# Patient Record
Sex: Female | Born: 1940 | Race: White | Hispanic: No | Marital: Married | Smoking: Never smoker
Health system: Southern US, Community
[De-identification: ages and names within clinical notes are randomized; demographics above are authoritative.]

## PROBLEM LIST (undated history)

## (undated) DIAGNOSIS — E785 Hyperlipidemia, unspecified: Secondary | ICD-10-CM

## (undated) DIAGNOSIS — I491 Atrial premature depolarization: Secondary | ICD-10-CM

## (undated) DIAGNOSIS — M1990 Unspecified osteoarthritis, unspecified site: Secondary | ICD-10-CM

## (undated) DIAGNOSIS — I480 Paroxysmal atrial fibrillation: Secondary | ICD-10-CM

## (undated) DIAGNOSIS — J309 Allergic rhinitis, unspecified: Secondary | ICD-10-CM

## (undated) DIAGNOSIS — N3090 Cystitis, unspecified without hematuria: Secondary | ICD-10-CM

## (undated) DIAGNOSIS — K219 Gastro-esophageal reflux disease without esophagitis: Secondary | ICD-10-CM

## (undated) DIAGNOSIS — F419 Anxiety disorder, unspecified: Secondary | ICD-10-CM

## (undated) DIAGNOSIS — L309 Dermatitis, unspecified: Secondary | ICD-10-CM

## (undated) HISTORY — DX: Hyperlipidemia, unspecified: E78.5

## (undated) HISTORY — DX: Gastro-esophageal reflux disease without esophagitis: K21.9

## (undated) HISTORY — DX: Unspecified osteoarthritis, unspecified site: M19.90

## (undated) HISTORY — DX: Cystitis, unspecified without hematuria: N30.90

## (undated) HISTORY — DX: Anxiety disorder, unspecified: F41.9

## (undated) HISTORY — DX: Allergic rhinitis, unspecified: J30.9

## (undated) HISTORY — DX: Atrial premature depolarization: I49.1

## (undated) HISTORY — DX: Paroxysmal atrial fibrillation: I48.0

## (undated) HISTORY — PX: COLONOSCOPY: SHX174

## (undated) HISTORY — DX: Dermatitis, unspecified: L30.9

---

## 1999-09-08 ENCOUNTER — Other Ambulatory Visit: Admission: RE | Admit: 1999-09-08 | Discharge: 1999-09-08 | Payer: Self-pay | Admitting: Internal Medicine

## 2000-09-27 ENCOUNTER — Other Ambulatory Visit: Admission: RE | Admit: 2000-09-27 | Discharge: 2000-09-27 | Payer: Self-pay | Admitting: Internal Medicine

## 2001-05-30 ENCOUNTER — Encounter: Payer: Self-pay | Admitting: Internal Medicine

## 2001-05-30 ENCOUNTER — Ambulatory Visit (HOSPITAL_COMMUNITY): Admission: RE | Admit: 2001-05-30 | Discharge: 2001-05-30 | Payer: Self-pay | Admitting: Internal Medicine

## 2002-06-23 ENCOUNTER — Encounter: Admission: RE | Admit: 2002-06-23 | Discharge: 2002-06-23 | Payer: Self-pay | Admitting: Internal Medicine

## 2002-06-23 ENCOUNTER — Encounter: Payer: Self-pay | Admitting: Internal Medicine

## 2002-06-23 ENCOUNTER — Other Ambulatory Visit: Admission: RE | Admit: 2002-06-23 | Discharge: 2002-06-23 | Payer: Self-pay | Admitting: Internal Medicine

## 2003-08-27 ENCOUNTER — Other Ambulatory Visit: Admission: RE | Admit: 2003-08-27 | Discharge: 2003-08-27 | Payer: Self-pay | Admitting: Internal Medicine

## 2003-12-22 ENCOUNTER — Ambulatory Visit (HOSPITAL_COMMUNITY): Admission: RE | Admit: 2003-12-22 | Discharge: 2003-12-22 | Payer: Self-pay | Admitting: Internal Medicine

## 2003-12-31 ENCOUNTER — Encounter: Admission: RE | Admit: 2003-12-31 | Discharge: 2003-12-31 | Payer: Self-pay | Admitting: Internal Medicine

## 2004-10-20 ENCOUNTER — Other Ambulatory Visit: Admission: RE | Admit: 2004-10-20 | Discharge: 2004-10-20 | Payer: Self-pay | Admitting: Internal Medicine

## 2004-12-13 ENCOUNTER — Ambulatory Visit (HOSPITAL_COMMUNITY): Admission: RE | Admit: 2004-12-13 | Discharge: 2004-12-13 | Payer: Self-pay | Admitting: *Deleted

## 2004-12-13 ENCOUNTER — Encounter (INDEPENDENT_AMBULATORY_CARE_PROVIDER_SITE_OTHER): Payer: Self-pay | Admitting: *Deleted

## 2004-12-13 HISTORY — PX: ESOPHAGOGASTRODUODENOSCOPY: SHX1529

## 2006-04-19 ENCOUNTER — Other Ambulatory Visit: Admission: RE | Admit: 2006-04-19 | Discharge: 2006-04-19 | Payer: Self-pay | Admitting: Internal Medicine

## 2006-09-26 ENCOUNTER — Ambulatory Visit (HOSPITAL_COMMUNITY): Admission: RE | Admit: 2006-09-26 | Discharge: 2006-09-26 | Payer: Self-pay | Admitting: Internal Medicine

## 2007-05-07 ENCOUNTER — Encounter (INDEPENDENT_AMBULATORY_CARE_PROVIDER_SITE_OTHER): Payer: Self-pay | Admitting: *Deleted

## 2007-05-07 ENCOUNTER — Ambulatory Visit (HOSPITAL_COMMUNITY): Admission: RE | Admit: 2007-05-07 | Discharge: 2007-05-07 | Payer: Self-pay | Admitting: *Deleted

## 2008-11-29 ENCOUNTER — Encounter: Admission: RE | Admit: 2008-11-29 | Discharge: 2008-11-29 | Payer: Self-pay | Admitting: Internal Medicine

## 2008-12-14 ENCOUNTER — Ambulatory Visit (HOSPITAL_COMMUNITY): Admission: RE | Admit: 2008-12-14 | Discharge: 2008-12-14 | Payer: Self-pay | Admitting: Otolaryngology

## 2008-12-28 ENCOUNTER — Encounter: Admission: RE | Admit: 2008-12-28 | Discharge: 2008-12-28 | Payer: Self-pay | Admitting: Internal Medicine

## 2009-09-16 DIAGNOSIS — I480 Paroxysmal atrial fibrillation: Secondary | ICD-10-CM

## 2009-09-16 HISTORY — DX: Paroxysmal atrial fibrillation: I48.0

## 2010-07-09 ENCOUNTER — Encounter: Payer: Self-pay | Admitting: Internal Medicine

## 2010-07-10 ENCOUNTER — Encounter: Payer: Self-pay | Admitting: Internal Medicine

## 2010-09-19 ENCOUNTER — Emergency Department (HOSPITAL_COMMUNITY)
Admission: EM | Admit: 2010-09-19 | Discharge: 2010-09-19 | Disposition: A | Discharge status: Home / Self Care | Payer: Medicare Other | Attending: Emergency Medicine | Admitting: Emergency Medicine

## 2010-09-19 DIAGNOSIS — I499 Cardiac arrhythmia, unspecified: Secondary | ICD-10-CM | POA: Insufficient documentation

## 2010-09-19 DIAGNOSIS — R300 Dysuria: Secondary | ICD-10-CM | POA: Insufficient documentation

## 2010-09-19 DIAGNOSIS — R5381 Other malaise: Secondary | ICD-10-CM | POA: Insufficient documentation

## 2010-09-19 DIAGNOSIS — Z79899 Other long term (current) drug therapy: Secondary | ICD-10-CM | POA: Insufficient documentation

## 2010-09-19 DIAGNOSIS — R5383 Other fatigue: Secondary | ICD-10-CM | POA: Insufficient documentation

## 2010-09-19 DIAGNOSIS — R002 Palpitations: Secondary | ICD-10-CM | POA: Insufficient documentation

## 2010-09-19 LAB — URINALYSIS, ROUTINE W REFLEX MICROSCOPIC
Bilirubin Urine: NEGATIVE
Glucose, UA: NEGATIVE mg/dL
Hgb urine dipstick: NEGATIVE
Ketones, ur: NEGATIVE mg/dL
Nitrite: NEGATIVE
Protein, ur: NEGATIVE mg/dL
Specific Gravity, Urine: 1.017 (ref 1.005–1.030)
Urobilinogen, UA: 0.2 mg/dL (ref 0.0–1.0)
pH: 7.5 (ref 5.0–8.0)

## 2010-09-19 LAB — POCT I-STAT, CHEM 8
BUN: 17 mg/dL (ref 6–23)
Calcium, Ion: 1.16 mmol/L (ref 1.12–1.32)
Chloride: 102 mEq/L (ref 96–112)
Creatinine, Ser: 0.8 mg/dL (ref 0.4–1.2)
Glucose, Bld: 108 mg/dL — ABNORMAL HIGH (ref 70–99)
HCT: 35 % — ABNORMAL LOW (ref 36.0–46.0)
Hemoglobin: 11.9 g/dL — ABNORMAL LOW (ref 12.0–15.0)
Potassium: 3.8 meq/L (ref 3.5–5.1)
Sodium: 139 meq/L (ref 135–145)
TCO2: 27 mmol/L (ref 0–100)

## 2010-10-31 NOTE — Op Note (Signed)
NAMELESHAWN, HOUSEWORTH NO.:  [id number]   MEDICAL RECORD NO.:  [id number]          PATIENT TYPE:  AMB   LOCATION:  ENDO                         FACILITY:  [facility]   PHYSICIAN:  Georgiana Spinner, M.D.    DATE OF BIRTH:  Mar 18, 1941   DATE OF PROCEDURE:  05/07/2007  DATE OF DISCHARGE:                               OPERATIVE REPORT   PROCEDURE:  Colonoscopy with biopsy.   INDICATIONS:  Colon polyp.   ANESTHESIA:  Fentanyl 75 mcg, Versed 7 mg.   PROCEDURE:  With the patient mildly sedated in the left lateral  decubitus position, the Pentax videoscopic colonoscope was inserted in  the rectum and passed under direct vision to the cecum identified by  ileocecal valve and appendiceal orifice both of which were photographed.  From this point the colonoscope was slowly withdrawn taking  circumferential views of colonic mucosa stopping at the hepatic flexure  where a small polyp was seen, photographed and removed using hot biopsy  forceps technique setting of 20/150 blended current.  We then withdrew  the colonoscope taking circumferential views of remaining colonic mucosa  stopping in the rectum which appeared normal on direct and showed  hemorrhoids on retroflexed view.  The endoscope was straightened and  withdrawn.  The patient's vital signs, pulse oximeter remained stable.  The patient tolerated procedure well without apparent complications.   FINDINGS:  Diverticulosis of sigmoid colon, mild to moderate, internal  hemorrhoids, polyp of hepatic flexure removed.  Await biopsy report.  The patient will call me for results and follow-up with me as needed as  an outpatient.           ______________________________  Georgiana Spinner, M.D.     GMO/MEDQ  D:  05/07/2007  T:  05/07/2007  Job:  [id number]

## 2010-11-03 NOTE — Op Note (Signed)
[name], [name] NO.:  [id number]   MEDICAL RECORD NO.:  [id number]          PATIENT TYPE:  AMB   LOCATION:  ENDO                         FACILITY:  [facility]   PHYSICIAN:  Georgiana Spinner, M.D.    DATE OF BIRTH:  24-Apr-1941   DATE OF PROCEDURE:  12/13/2004  DATE OF DISCHARGE:                                 OPERATIVE REPORT   PROCEDURE:  Upper endoscopy.   INDICATIONS FOR PROCEDURE:  Gastroesophageal reflux disease.   ANESTHESIA:  Demerol 50, Versed 5 mg.   DESCRIPTION OF PROCEDURE:  With the patient mildly sedated in the left  lateral decubitus position, the Olympus videoscopic endoscope was inserted  in the mouth and passed under direct vision through the esophagus and there  was a question of possible Barrett's photographed and biopsied. We entered  into the stomach, fundus body, antrum, duodenal bulb and second portion of  the duodenum and all appeared normal. From this point, the endoscope was  slowly withdrawn taking circumferential views of the duodenal mucosa until  the endoscope had been pulled back into the stomach, placed in retroflexion  to view the stomach from below. The endoscope was straightened and withdrawn  taking circumferential views of the remaining gastric and esophageal mucosa.  The patient's vital signs and pulse oximeter remained stable. The patient  tolerated the procedure well without apparent complications.   FINDINGS:  Changes of distal esophagus biopsied. Await biopsy report. The  patient will call me for results and followup with me in as an outpatient.  Proceed to colonoscopy.       GMO/MEDQ  D:  12/13/2004  T:  12/13/2004  Job:  [id number]

## 2010-11-03 NOTE — Op Note (Signed)
Rebecca Phillips, LUALLEN NO.:  [id number]   MEDICAL RECORD NO.:  [id number]          PATIENT TYPE:  AMB   LOCATION:  ENDO                         FACILITY:  [facility]   PHYSICIAN:  Georgiana Spinner, M.D.    DATE OF BIRTH:  May 10, 1941   DATE OF PROCEDURE:  12/13/2004  DATE OF DISCHARGE:                                 OPERATIVE REPORT   PROCEDURE:  Colonoscopy with polypectomy and biopsy.   INDICATIONS:  Colon polyps.   ANESTHESIA:  Demerol 10 mg, Versed 2 mg.   DESCRIPTION OF PROCEDURE:  With the patient mildly sedated in the left  lateral decubitus position, the Olympus videoscopic colonoscope was inserted  in the rectum and passed under direct vision to the cecum identified by the  ileocecal valve and crow's foot of the cecum. From this point, the  colonoscope was slowly withdrawn taking circumferential views of the colonic  mucosa stopping in the proximal descending colon where a polyp was seen  photographed, removed using snare cautery technique setting 20/20 blended  current. There was a small residual piece that was left behind and I removed  that using hot biopsy forceps technique with the same setting. The endoscope  was then further withdrawn to the rectum, stopping only to photograph  diverticula seen along the way in the rectum. The endoscope was placed in  retroflexion to view the anal canal from above, this was photographed. The  endoscope was straightened, withdrawn. The patient's vital signs and pulse  oximeter remained stable. The patient tolerated the procedure well without  apparent complications.   FINDINGS:  Polyp of descending colon, diverticulosis of sigmoid colon.  Internal hemorrhoids. Await biopsy report. The patient will call me for  results and follow-up with me as an outpatient       GMO/MEDQ  D:  12/13/2004  T:  12/13/2004  Job:  [id number]

## 2011-09-26 ENCOUNTER — Ambulatory Visit
Admission: RE | Admit: 2011-09-26 | Discharge: 2011-09-26 | Disposition: A | Discharge status: Home / Self Care | Payer: Medicare Other | Source: Ambulatory Visit | Attending: Internal Medicine | Admitting: Internal Medicine

## 2011-09-26 ENCOUNTER — Other Ambulatory Visit: Payer: Self-pay | Admitting: Internal Medicine

## 2011-09-26 DIAGNOSIS — K5790 Diverticulosis of intestine, part unspecified, without perforation or abscess without bleeding: Secondary | ICD-10-CM

## 2011-09-26 MED ORDER — IOHEXOL 300 MG/ML  SOLN
100.0000 mL | Freq: Once | INTRAMUSCULAR | Status: AC | PRN
  Administered 2011-09-26: 100 mL via INTRAVENOUS

## 2012-01-16 ENCOUNTER — Other Ambulatory Visit: Payer: Self-pay | Admitting: Internal Medicine

## 2012-01-16 DIAGNOSIS — R102 Pelvic and perineal pain: Secondary | ICD-10-CM

## 2012-01-21 ENCOUNTER — Ambulatory Visit
Admission: RE | Admit: 2012-01-21 | Discharge: 2012-01-21 | Disposition: A | Discharge status: Home / Self Care | Payer: Medicare Other | Source: Ambulatory Visit | Attending: Internal Medicine | Admitting: Internal Medicine

## 2012-01-21 DIAGNOSIS — R102 Pelvic and perineal pain: Secondary | ICD-10-CM

## 2012-01-28 ENCOUNTER — Other Ambulatory Visit: Payer: Self-pay | Admitting: Registered Nurse

## 2012-01-28 ENCOUNTER — Other Ambulatory Visit (HOSPITAL_COMMUNITY)
Admission: RE | Admit: 2012-01-28 | Discharge: 2012-01-28 | Disposition: A | Discharge status: Home / Self Care | Payer: Medicare Other | Source: Ambulatory Visit | Attending: Internal Medicine | Admitting: Internal Medicine

## 2012-01-28 DIAGNOSIS — Z124 Encounter for screening for malignant neoplasm of cervix: Secondary | ICD-10-CM | POA: Insufficient documentation

## 2012-03-19 ENCOUNTER — Other Ambulatory Visit (HOSPITAL_COMMUNITY): Payer: Self-pay | Admitting: Internal Medicine

## 2012-03-19 ENCOUNTER — Ambulatory Visit (HOSPITAL_COMMUNITY)
Admission: RE | Admit: 2012-03-19 | Discharge: 2012-03-19 | Disposition: A | Discharge status: Home / Self Care | Payer: Medicare Other | Source: Ambulatory Visit | Attending: Internal Medicine | Admitting: Internal Medicine

## 2012-03-19 DIAGNOSIS — Z1231 Encounter for screening mammogram for malignant neoplasm of breast: Secondary | ICD-10-CM | POA: Insufficient documentation

## 2012-03-19 DIAGNOSIS — Z0000 Encounter for general adult medical examination without abnormal findings: Secondary | ICD-10-CM

## 2015-04-01 DIAGNOSIS — Z23 Encounter for immunization: Secondary | ICD-10-CM | POA: Diagnosis not present

## 2015-04-18 DIAGNOSIS — R69 Illness, unspecified: Secondary | ICD-10-CM | POA: Diagnosis not present

## 2015-10-25 DIAGNOSIS — R05 Cough: Secondary | ICD-10-CM | POA: Diagnosis not present

## 2015-10-25 DIAGNOSIS — R490 Dysphonia: Secondary | ICD-10-CM | POA: Diagnosis not present

## 2015-10-25 DIAGNOSIS — R079 Chest pain, unspecified: Secondary | ICD-10-CM | POA: Diagnosis not present

## 2015-10-25 DIAGNOSIS — J0110 Acute frontal sinusitis, unspecified: Secondary | ICD-10-CM | POA: Diagnosis not present

## 2015-10-26 ENCOUNTER — Ambulatory Visit: Payer: Self-pay | Admitting: Cardiovascular Disease

## 2015-11-18 DIAGNOSIS — K219 Gastro-esophageal reflux disease without esophagitis: Secondary | ICD-10-CM | POA: Diagnosis not present

## 2015-11-18 DIAGNOSIS — R490 Dysphonia: Secondary | ICD-10-CM | POA: Diagnosis not present

## 2015-11-18 DIAGNOSIS — R42 Dizziness and giddiness: Secondary | ICD-10-CM | POA: Diagnosis not present

## 2015-11-18 DIAGNOSIS — H903 Sensorineural hearing loss, bilateral: Secondary | ICD-10-CM | POA: Diagnosis not present

## 2015-11-18 DIAGNOSIS — H7403 Tympanosclerosis, bilateral: Secondary | ICD-10-CM | POA: Diagnosis not present

## 2015-11-18 DIAGNOSIS — R51 Headache: Secondary | ICD-10-CM | POA: Diagnosis not present

## 2015-11-18 DIAGNOSIS — H9313 Tinnitus, bilateral: Secondary | ICD-10-CM | POA: Diagnosis not present

## 2015-11-18 DIAGNOSIS — H9113 Presbycusis, bilateral: Secondary | ICD-10-CM | POA: Diagnosis not present

## 2015-11-22 NOTE — Progress Notes (Signed)
No show

## 2015-11-24 ENCOUNTER — Encounter: Payer: Self-pay | Admitting: Cardiovascular Disease

## 2015-11-30 DIAGNOSIS — R05 Cough: Secondary | ICD-10-CM | POA: Diagnosis not present

## 2015-11-30 DIAGNOSIS — G501 Atypical facial pain: Secondary | ICD-10-CM | POA: Diagnosis not present

## 2015-11-30 DIAGNOSIS — R51 Headache: Secondary | ICD-10-CM | POA: Diagnosis not present

## 2015-12-05 DIAGNOSIS — R300 Dysuria: Secondary | ICD-10-CM | POA: Diagnosis not present

## 2015-12-05 DIAGNOSIS — J0100 Acute maxillary sinusitis, unspecified: Secondary | ICD-10-CM | POA: Diagnosis not present

## 2015-12-05 DIAGNOSIS — Z0000 Encounter for general adult medical examination without abnormal findings: Secondary | ICD-10-CM | POA: Diagnosis not present

## 2015-12-05 DIAGNOSIS — H9319 Tinnitus, unspecified ear: Secondary | ICD-10-CM | POA: Diagnosis not present

## 2016-01-03 ENCOUNTER — Telehealth: Payer: Self-pay | Admitting: *Deleted

## 2016-01-03 NOTE — Telephone Encounter (Signed)
I called the patient to get her primary care provider information, she provided it. I also called here PCP's office to request that they fax over the records.

## 2016-01-09 DIAGNOSIS — M1711 Unilateral primary osteoarthritis, right knee: Secondary | ICD-10-CM | POA: Diagnosis not present

## 2016-01-09 DIAGNOSIS — M25561 Pain in right knee: Secondary | ICD-10-CM | POA: Diagnosis not present

## 2016-01-18 DIAGNOSIS — E7800 Pure hypercholesterolemia, unspecified: Secondary | ICD-10-CM | POA: Diagnosis not present

## 2016-01-18 DIAGNOSIS — K219 Gastro-esophageal reflux disease without esophagitis: Secondary | ICD-10-CM | POA: Diagnosis not present

## 2016-01-18 DIAGNOSIS — Z0000 Encounter for general adult medical examination without abnormal findings: Secondary | ICD-10-CM | POA: Diagnosis not present

## 2016-01-20 ENCOUNTER — Ambulatory Visit: Payer: Self-pay | Admitting: Cardiovascular Disease

## 2016-01-24 DIAGNOSIS — I10 Essential (primary) hypertension: Secondary | ICD-10-CM | POA: Diagnosis not present

## 2016-01-24 DIAGNOSIS — Z0000 Encounter for general adult medical examination without abnormal findings: Secondary | ICD-10-CM | POA: Diagnosis not present

## 2016-02-02 ENCOUNTER — Encounter: Payer: Self-pay | Admitting: Cardiovascular Disease

## 2016-02-22 DIAGNOSIS — M859 Disorder of bone density and structure, unspecified: Secondary | ICD-10-CM | POA: Diagnosis not present

## 2016-02-22 DIAGNOSIS — M1990 Unspecified osteoarthritis, unspecified site: Secondary | ICD-10-CM | POA: Diagnosis not present

## 2016-02-22 DIAGNOSIS — Z23 Encounter for immunization: Secondary | ICD-10-CM | POA: Diagnosis not present

## 2016-02-22 DIAGNOSIS — E7800 Pure hypercholesterolemia, unspecified: Secondary | ICD-10-CM | POA: Diagnosis not present

## 2016-02-22 DIAGNOSIS — I10 Essential (primary) hypertension: Secondary | ICD-10-CM | POA: Diagnosis not present

## 2016-02-27 NOTE — Progress Notes (Signed)
Cardiology Office Note   Date:  02/28/2016   ID:  Rebecca Phillips, DOB January 10, 1941, MRN [id number]  PCP:  Pearson GrippeJames Kim, MD  Cardiologist:   Charlton HawsPeter Collier Monica, MD   Chief Complaint  Patient presents with  . Establish Care    surgical clearance      History of Present Illness: Rebecca Phillips is a 75 y.o. female who presents for evaluation of chest pain has had viral illness with frontal sinusitis. Associated with hoarseness cough.  Sharp SSCP related to this. Rx with Zpak and claritin.  Last office visit with Dr Selena BattenKim 10/25/15 given Levaquin.  And [facility]  Patient indicated SSCP for a year and That it has preceded current issues with sinusitis and viral illness   Labs reviewed.  12/05/15 Hct 38.3 PLT 228 UA neg HDL 67 LDL 134 TC 217 Triglycerides 79 TSH 2.2   Has had left TKR 6 years ago with no issues Needs right knee done Atypical SSCP over a year. Fleeting sharp pains in chest not related to exertion    Past Medical History:  Diagnosis Date  . Allergic rhinitis   . Cystitis   . Eczema   . GERD (gastroesophageal reflux disease)   . Hyperlipidemia   . Recurrent anxiety     Past Surgical History:  Procedure Laterality Date  . COLONOSCOPY    . ESOPHAGOGASTRODUODENOSCOPY  12/13/2004   =>squamosu and cardia type mucosa with mild chronic inflammation     Current Outpatient Prescriptions  Medication Sig Dispense Refill  . CALCIUM-VITAMIN D PO Take 1 tablet by mouth daily.    . Cholecalciferol (VITAMIN D-3) 1000 units CAPS Take 5 capsules by mouth daily.    . clonazePAM (KLONOPIN) 1 MG tablet Take 1 mg by mouth daily as needed for anxiety.    . Multiple Vitamins-Minerals (MULTIVITAMIN PO) Take 1 tablet by mouth daily.    Marland Kitchen. omeprazole (PRILOSEC) 20 MG capsule Take 20 mg by mouth daily.    . pravastatin (PRAVACHOL) 40 MG tablet Take 40 mg by mouth daily.    . traMADol (ULTRAM) 50 MG tablet Take 50 mg by mouth as needed for moderate pain.     No current  facility-administered medications for this visit.     Allergies:   Review of patient's allergies indicates no known allergies.    Social History:  The patient  reports that she has never smoked. She has never used smokeless tobacco.   Family History:  The patient's family history includes CVA in her maternal grandfather; Colon cancer in her paternal aunt and son; Healthy in her sister; Heart attack in her father and paternal grandfather; Heart disease in her brother; Hip fracture in her mother; Hypertension in her father; Osteoarthritis in her sister; Osteoporosis in her mother and sister; Other in her mother; Prostate cancer in her father; Throat cancer in her maternal uncle; Transient ischemic attack in her mother.    ROS:  Please see the history of present illness.   Otherwise, review of systems are positive for none.   All other systems are reviewed and negative.    PHYSICAL EXAM: VS:  BP (!) 150/60   Pulse (!) 51   Ht 5\' 2"  (1.575 m)   Wt 64.8 kg (142 lb 12.8 oz)   SpO2 98%   BMI 26.12 kg/m  , BMI Body mass index is 26.12 kg/m. Affect appropriate Healthy:  appears stated age HEENT: normal Neck supple with no adenopathy JVP normal no  bruits no thyromegaly Lungs clear with upper airway wheezing and good diaphragmatic motion Heart:  S1/S2 no murmur, no rub, gallop or click PMI normal Abdomen: benighn, BS positve, no tenderness, no AAA no bruit.  No HSM or HJR Distal pulses intact with no bruits No edema Neuro non-focal Skin warm and dry No muscular weakness Left TKR Right decreased range of motion     EKG:  2012 SR rate 79 LAD RSR'  02/28/16 SR ? Anterior MI loss of precordial R wave Nonspecific ST T wave changes    Recent Labs: No results found for requested labs within last 8760 hours.    Lipid Panel No results found for: CHOL, TRIG, HDL, CHOLHDL, VLDL, LDLCALC, LDLDIRECT    Wt Readings from Last 3 Encounters:  02/28/16 64.8 kg (142 lb 12.8 oz)       Other studies Reviewed: Additional studies/ records that were reviewed today include: Notes primary Dr Selena Batten And labs notes ENT Dr Lazarus Salines .    ASSESSMENT AND PLAN:  1.  Preop:  Elderly female with abnormal ECG and chest pain Although atypical with moderate Risk ortho surgery will order lexiscan myovue 2. Sinusitis:  Resolved post antiibiotics 3. URI:  Still with upper airway wheezing will need preop CXR   4. Chol:  Continue statin 5. Abnormal ECG doubt old anterior MI f/u myovue likely low cardiac position in chest    Current medicines are reviewed at length with the patient today.  The patient does not have concerns regarding medicines.  The following changes have been made:  no change  Labs/ tests ordered today include: Myovue   Orders Placed This Encounter  Procedures  . Myocardial Perfusion Imaging  . EKG 12-Lead     Disposition:   FU with PRN      Signed, Charlton Haws, MD  02/28/2016 9:36 AM    [facility] [address] [place], [place], [place]  [address] Phone: [phone]; Fax: [phone]

## 2016-02-28 ENCOUNTER — Ambulatory Visit (INDEPENDENT_AMBULATORY_CARE_PROVIDER_SITE_OTHER): Payer: Medicare HMO | Admitting: Cardiovascular Disease

## 2016-02-28 ENCOUNTER — Telehealth (HOSPITAL_COMMUNITY): Payer: Self-pay | Admitting: *Deleted

## 2016-02-28 ENCOUNTER — Encounter: Payer: Self-pay | Admitting: Cardiovascular Disease

## 2016-02-28 ENCOUNTER — Ambulatory Visit: Payer: Self-pay | Admitting: Cardiovascular Disease

## 2016-02-28 VITALS — BP 150/60 | HR 51 | Ht 62.0 in | Wt 142.8 lb

## 2016-02-28 DIAGNOSIS — Z7689 Persons encountering health services in other specified circumstances: Secondary | ICD-10-CM

## 2016-02-28 DIAGNOSIS — Z01818 Encounter for other preprocedural examination: Secondary | ICD-10-CM

## 2016-02-28 DIAGNOSIS — Z7189 Other specified counseling: Secondary | ICD-10-CM | POA: Diagnosis not present

## 2016-02-28 DIAGNOSIS — R0789 Other chest pain: Secondary | ICD-10-CM | POA: Diagnosis not present

## 2016-02-28 NOTE — Telephone Encounter (Signed)
Patient given detailed instructions per Myocardial Perfusion Study Information Sheet for the test on 02/29/16 at 7:30. Patient notified to arrive 15 minutes early and that it is imperative to arrive on time for appointment to keep from having the test rescheduled.  If you need to cancel or reschedule your appointment, please call the office within 24 hours of your appointment. Failure to do so may result in a cancellation of your appointment, and a $50 no show fee. Patient verbalized understanding.Daneil DolinSharon S Brooks

## 2016-02-28 NOTE — Patient Instructions (Addendum)
Medication Instructions:  Your physician recommends that you continue on your current medications as directed. Please refer to the Current Medication list given to you today.  Labwork: NONE  Testing/Procedures: Your physician has requested that you have a lexiscan myoview. For further information please visit [email or web address]. Please follow instruction sheet, as given.  Follow-Up: Your physician wants you to follow-up as needed with Dr. Nishan.    If you need a refill on your cardiac medications before your next appointment, please call your pharmacy.    

## 2016-02-29 ENCOUNTER — Ambulatory Visit (HOSPITAL_COMMUNITY): Payer: Medicare HMO | Attending: Cardiovascular Disease

## 2016-02-29 ENCOUNTER — Encounter (INDEPENDENT_AMBULATORY_CARE_PROVIDER_SITE_OTHER): Payer: Self-pay

## 2016-02-29 DIAGNOSIS — Z01818 Encounter for other preprocedural examination: Secondary | ICD-10-CM

## 2016-02-29 DIAGNOSIS — R0789 Other chest pain: Secondary | ICD-10-CM | POA: Diagnosis not present

## 2016-02-29 DIAGNOSIS — Z7189 Other specified counseling: Secondary | ICD-10-CM

## 2016-02-29 DIAGNOSIS — R9439 Abnormal result of other cardiovascular function study: Secondary | ICD-10-CM | POA: Diagnosis not present

## 2016-02-29 DIAGNOSIS — Z7689 Persons encountering health services in other specified circumstances: Secondary | ICD-10-CM

## 2016-02-29 LAB — MYOCARDIAL PERFUSION IMAGING
CHL CUP NUCLEAR SSS: 15
CHL CUP RESTING HR STRESS: 71 {beats}/min
LV sys vol: 19 mL
LVDIAVOL: 65 mL (ref 46–106)
NUC STRESS TID: 0.93
Peak HR: 96 {beats}/min
RATE: 0.37
SDS: 2
SRS: 13

## 2016-02-29 MED ORDER — TECHNETIUM TC 99M TETROFOSMIN IV KIT
10.7000 | PACK | Freq: Once | INTRAVENOUS | Status: AC | PRN
  Administered 2016-02-29: 11 via INTRAVENOUS
  Filled 2016-02-29: qty 11

## 2016-02-29 MED ORDER — TECHNETIUM TC 99M TETROFOSMIN IV KIT
30.0000 | PACK | Freq: Once | INTRAVENOUS | Status: AC | PRN
  Administered 2016-02-29: 30 via INTRAVENOUS
  Filled 2016-02-29: qty 30

## 2016-02-29 MED ORDER — REGADENOSON 0.4 MG/5ML IV SOLN
0.4000 mg | Freq: Once | INTRAVENOUS | Status: AC
  Administered 2016-02-29: 0.4 mg via INTRAVENOUS

## 2016-04-12 DIAGNOSIS — M1711 Unilateral primary osteoarthritis, right knee: Secondary | ICD-10-CM | POA: Diagnosis not present

## 2016-04-23 DIAGNOSIS — M1711 Unilateral primary osteoarthritis, right knee: Secondary | ICD-10-CM | POA: Insufficient documentation

## 2016-05-01 DIAGNOSIS — Z01812 Encounter for preprocedural laboratory examination: Secondary | ICD-10-CM | POA: Diagnosis not present

## 2016-05-16 ENCOUNTER — Telehealth: Payer: Self-pay | Admitting: Cardiovascular Disease

## 2016-05-16 ENCOUNTER — Encounter (INDEPENDENT_AMBULATORY_CARE_PROVIDER_SITE_OTHER): Payer: Self-pay

## 2016-05-16 ENCOUNTER — Encounter: Payer: Self-pay | Admitting: Physician Assistant

## 2016-05-16 ENCOUNTER — Ambulatory Visit (INDEPENDENT_AMBULATORY_CARE_PROVIDER_SITE_OTHER): Payer: Medicare HMO | Admitting: Physician Assistant

## 2016-05-16 ENCOUNTER — Encounter: Payer: Self-pay | Admitting: *Deleted

## 2016-05-16 VITALS — BP 150/68 | HR 62 | Ht 62.0 in | Wt 144.8 lb

## 2016-05-16 DIAGNOSIS — E7800 Pure hypercholesterolemia, unspecified: Secondary | ICD-10-CM | POA: Diagnosis not present

## 2016-05-16 DIAGNOSIS — R072 Precordial pain: Secondary | ICD-10-CM | POA: Diagnosis not present

## 2016-05-16 DIAGNOSIS — R079 Chest pain, unspecified: Secondary | ICD-10-CM | POA: Diagnosis not present

## 2016-05-16 DIAGNOSIS — R002 Palpitations: Secondary | ICD-10-CM | POA: Diagnosis not present

## 2016-05-16 DIAGNOSIS — R030 Elevated blood-pressure reading, without diagnosis of hypertension: Secondary | ICD-10-CM | POA: Diagnosis not present

## 2016-05-16 LAB — CBC
HCT: 37.9 % (ref 35.0–45.0)
HEMOGLOBIN: 12.3 g/dL (ref 11.7–15.5)
MCH: 29.1 pg (ref 27.0–33.0)
MCHC: 32.5 g/dL (ref 32.0–36.0)
MCV: 89.6 fL (ref 80.0–100.0)
MPV: 8.7 fL (ref 7.5–12.5)
Platelets: 250 10*3/uL (ref 140–400)
RBC: 4.23 MIL/uL (ref 3.80–5.10)
RDW: 14.5 % (ref 11.0–15.0)
WBC: 5.9 10*3/uL (ref 3.8–10.8)

## 2016-05-16 LAB — BASIC METABOLIC PANEL
BUN: 12 mg/dL (ref 7–25)
CALCIUM: 9.3 mg/dL (ref 8.6–10.4)
CO2: 26 mmol/L (ref 20–31)
Chloride: 104 mmol/L (ref 98–110)
Creat: 0.76 mg/dL (ref 0.60–0.93)
Glucose, Bld: 80 mg/dL (ref 65–99)
Potassium: 4.3 mmol/L (ref 3.5–5.3)
SODIUM: 139 mmol/L (ref 135–146)

## 2016-05-16 MED ORDER — NITROGLYCERIN 0.4 MG SL SUBL: 0.4000 mg | SUBLINGUAL_TABLET | SUBLINGUAL | 3 refills | Status: DC | PRN

## 2016-05-16 MED ORDER — ASPIRIN EC 81 MG PO TBEC: 81.0000 mg | DELAYED_RELEASE_TABLET | Freq: Every day | ORAL | Status: AC

## 2016-05-16 NOTE — Telephone Encounter (Signed)
New Message  Pts daughter voiced pt was to have knee surgery but pt had chest pain and couldn't move forward with surgery.  Pts daughter voiced he had stress test and displayed weakness on left side.  Pts daughter voiced MD/Doc/Anaesthesiologist  stated pt needs to have heart cath before knee surgery.  Please f/u with pt

## 2016-05-16 NOTE — Patient Instructions (Addendum)
Medication Instructions:  1. CONTINUE ASPIRIN 81 MG DAILY 2. AN RX FOR NITROGLYCERIN HAS BEEN SENT IN AND YOU HAVE BEEN ADVISED AS TO HOW AND WHEN TO USE NTG  Labwork: TODAY BMET, CBC, PT/INR (PRE CATH)  Testing/Procedures: Your physician has requested that you have a cardiac catheterization. Cardiac catheterization is used to diagnose and/or treat various heart conditions. Doctors may recommend this procedure for a number of different reasons. The most common reason is to evaluate chest pain. Chest pain can be a symptom of coronary artery disease (CAD), and cardiac catheterization can show whether plaque is narrowing or blocking your heart's arteries. This procedure is also used to evaluate the valves, as well as measure the blood flow and oxygen levels in different parts of your heart. For further information please visit [email or web address] Please follow instruction sheet, as given.  Follow-Up: Tereso NewcomerSCOTT WEAVER, [facility] 06/01/16 @ 10:45  Any Other Special Instructions Will Be Listed Below (If Applicable).  If you need a refill on your cardiac medications before your next appointment, please call your pharmacy.

## 2016-05-16 NOTE — Progress Notes (Signed)
Cardiology Office Note:    Date:  05/16/2016   ID:  Rebecca Phillips, DOB March 28, 1941, MRN [id number]  PCP:  Pearson GrippeJames Kim, MD  Cardiologist:  Dr. Charlton HawsPeter Nishan   Electrophysiologist:  n/a  Referring MD: Pearson GrippeKim, James, MD   Chief Complaint  Patient presents with  . Chest Pain   History of Present Illness:    Rebecca GoodellDoris J Rickett is a 75 y.o. female with a hx of HL.  She saw Dr. Charlton HawsPeter Nishan in 9/17 for surgical clearance.  She noted chest pain and had an abnormal ECG.  Myoview was done and this was low risk.  She was cleared for surgery.  [facility] with Dr. Earma ReadingBashore.  She noted significant chest pain and her surgery was cancelled.   She was referred back her for evaluation.  She is here with her husband and daughter.  She notes significant chest pain over the past several weeks.  It seems to occur more at rest than with exertion.  Although, she does have some discomfort with exertion.  She notes a lot of discomfort mainly at night.  She denies associated nausea or diaphoresis.  She had a severe episode 2 weeks ago while at rest that last several minutes.  She took 3 NTG with relief.  She has not had a severe episode like this since.  She denies assoc with meals. She denies dysphagia, odynophagia.  She denies melena, hematochezia.  She denies pleuritic chest pain or chest pain with lying supine.  She has not taken antacids for her pain.  She does have GERD and she takes Prilosec.  She denies orthopnea, PND, edema. She denies syncope.    Prior CV studies that were reviewed today include:    Myoview 02/29/16 EF 71, PACs, brief PAT - ? AFib, inferoseptal infarct, no ischemia; Low Risk  Past Medical History:  Diagnosis Date  . Allergic rhinitis   . Cystitis   . Eczema   . GERD (gastroesophageal reflux disease)   . Hyperlipidemia   . Recurrent anxiety     Past Surgical History:  Procedure Laterality Date  . COLONOSCOPY    . ESOPHAGOGASTRODUODENOSCOPY  12/13/2004   =>squamosu and cardia type mucosa with mild chronic inflammation    Current Medications: Current Meds  Medication Sig  . Cholecalciferol (VITAMIN D-3) 1000 units CAPS Take 4,000 Units by mouth daily.   . clonazePAM (KLONOPIN) 1 MG tablet Take 1 mg by mouth daily as needed for anxiety.  . Multiple Vitamins-Minerals (MULTIVITAMIN PO) Take 1 tablet by mouth daily.  Marland Kitchen. omeprazole (PRILOSEC) 20 MG capsule Take 20 mg by mouth daily.  . pravastatin (PRAVACHOL) 40 MG tablet Take 60 mg by mouth daily.   . traMADol (ULTRAM) 50 MG tablet Take 50 mg by mouth 2 (two) times daily as needed for moderate pain.   . [DISCONTINUED] CALCIUM-VITAMIN D PO Take 1 tablet by mouth daily.     Allergies:   Patient has no known allergies.   Social History   Social History  . Marital status: Married    Spouse name: N/A  . Number of children: N/A  . Years of education: N/A   Social History Main Topics  . Smoking status: Never Smoker  . Smokeless tobacco: Never Used  . Alcohol use None  . Drug use: Unknown  . Sexual activity: Not Asked   Other Topics Concern  . None   Social History Narrative  . None  Family History:  The patient's family history includes CVA in her maternal grandfather; Colon cancer in her paternal aunt and son; Healthy in her sister; Heart attack in her father and paternal grandfather; Heart disease in her brother; Hip fracture in her mother; Hypertension in her father; Osteoarthritis in her sister; Osteoporosis in her mother and sister; Other in her mother; Prostate cancer in her father; Throat cancer in her maternal uncle; Transient ischemic attack in her mother.   ROS:   Please see the history of present illness.    ROS All other systems reviewed and are negative.   EKGs/Labs/Other Test Reviewed:    EKG:  EKG is  ordered today.  The ekg ordered today demonstrates NSR, HR 62, PACs, QTc 434 ms, RSR prime V1-V2  Recent Labs: No results found for requested labs within last  8760 hours.   Recent Lipid Panel No results found for: CHOL, TRIG, HDL, CHOLHDL, VLDL, LDLCALC, LDLDIRECT   Physical Exam:    VS:  BP (!) 150/68   Pulse 62   Ht 5\' 2"  (1.575 m)   Wt 144 lb 12.8 oz (65.7 kg)   BMI 26.48 kg/m     Wt Readings from Last 3 Encounters:  05/16/16 144 lb 12.8 oz (65.7 kg)  02/29/16 142 lb (64.4 kg)  02/28/16 142 lb 12.8 oz (64.8 kg)     Physical Exam  Constitutional: She is oriented to person, place, and time. She appears well-developed and well-nourished. No distress.  HENT:  Head: Normocephalic and atraumatic.  Eyes: No scleral icterus.  Neck: No JVD present. Carotid bruit is not present.  Cardiovascular: Normal rate, regular rhythm and normal heart sounds.   No murmur heard. Pulmonary/Chest: Effort normal. She has no wheezes. She has no rales.  Abdominal: Soft. There is no tenderness.  Musculoskeletal: She exhibits no edema.  Neurological: She is alert and oriented to person, place, and time.  Skin: Skin is warm and dry.  Psychiatric: She has a normal mood and affect.    ASSESSMENT:    1. Precordial pain   2. Palpitations   3. Pure hypercholesterolemia   4. Elevated blood pressure reading    PLAN:    In order of problems listed above:  1. Chest pain - Her symptoms have typical and atypical features. She had an episode 2 weeks ago that was more concerning and she did have relief with nitroglycerin. Her recent stress test was originally read out as demonstrating inferoseptal scar. This was overall low risk. Dr. Eden EmmsNishan felt of the images looked normal. In any event, the patient continues to have symptoms. I reviewed her case today with Dr. Eden EmmsNishan. We have recommended proceeding with cardiac catheterization to better evaluate her chest symptoms. Risks and benefits of cardiac catheterization have been discussed with the patient.  These include bleeding, infection, kidney damage, stroke, heart attack, death.  The patient understands these risks  and is willing to proceed.   -  Continue aspirin 81 daily  -  Prescription given for when necessary nitroglycerin  -  Consider GI referral if cardiac catheterization normal.  2. Palpitations - She notes occasional palpitations and irregular heartbeat. There is a question of atrial fibrillation on her stress test. She is convinced that she has atrial fibrillation. Consider arranging event monitor at follow-up after cardiac catheterization.  3. HL- Continue statin.  4. Elevated blood pressure - Her blood pressures has been a bit higher than normal recently. I have asked her to keep track of her blood  pressures. Consider adding amlodipine 2.5 mg daily if blood pressure remains above target.   Medication Adjustments/Labs and Tests Ordered: Current medicines are reviewed at length with the patient today.  Concerns regarding medicines are outlined above.  Medication changes, Labs and Tests ordered today are outlined in the Patient Instructions noted below. Patient Instructions  Medication Instructions:  1. CONTINUE ASPIRIN 81 MG DAILY 2. AN RX FOR NITROGLYCERIN HAS BEEN SENT IN AND YOU HAVE BEEN ADVISED AS TO HOW AND WHEN TO USE NTG  Labwork: TODAY BMET, CBC, PT/INR (PRE CATH)  Testing/Procedures: Your physician has requested that you have a cardiac catheterization. Cardiac catheterization is used to diagnose and/or treat various heart conditions. Doctors may recommend this procedure for a number of different reasons. The most common reason is to evaluate chest pain. Chest pain can be a symptom of coronary artery disease (CAD), and cardiac catheterization can show whether plaque is narrowing or blocking your heart's arteries. This procedure is also used to evaluate the valves, as well as measure the blood flow and oxygen levels in different parts of your heart. For further information please visit [email or web address]. Please follow instruction sheet, as given.  Follow-Up: Tereso NewcomerSCOTT Jamilla Galli, [facility]  06/01/16 @ 10:45  Any Other Special Instructions Will Be Listed Below (If Applicable).  If you need a refill on your cardiac medications before your next appointment, please call your pharmacy.  Signed, Tereso NewcomerScott Ikaika Showers, PA-C  05/16/2016 4:39 PM    [facility] [address], [place], [place]  [address] Phone: [phone]; Fax: [phone]

## 2016-05-16 NOTE — Telephone Encounter (Signed)
Called pts daughter back.  She states that pt was being prepped for her knee sx that she was cleared for back in September and pt started having real bad chest pain so anesthesiologist cancelled her surgery and told the pt that she may need a cath before she has this sx done.  Pt daughter, did state that pt was out of town 3 weeks ago and had some cp and took one of her husbands nitro and it went away and that she couldn't get pt to go to the ER due to being out of town.  Daughter also mentions that she was told when pt went in for pre-op that pts heart was weak on the left side and that this was another concern for her.  I offered pt an appt today to see Tereso NewcomerScott Weaver, PA-C @ 2:15 and they accepted.

## 2016-05-17 LAB — PROTIME-INR
INR: 1
PROTHROMBIN TIME: 10.6 s (ref 9.0–11.5)

## 2016-05-18 ENCOUNTER — Ambulatory Visit (HOSPITAL_BASED_OUTPATIENT_CLINIC_OR_DEPARTMENT_OTHER): Payer: Medicare HMO

## 2016-05-18 ENCOUNTER — Encounter (HOSPITAL_COMMUNITY)
Admission: RE | Disposition: A | Discharge status: Home / Self Care | Payer: Self-pay | Source: Ambulatory Visit | Attending: Cardiovascular Disease

## 2016-05-18 ENCOUNTER — Ambulatory Visit (HOSPITAL_COMMUNITY)
Admission: RE | Admit: 2016-05-18 | Discharge: 2016-05-18 | Disposition: A | Discharge status: Home / Self Care | Payer: Medicare HMO | Source: Ambulatory Visit | Attending: Cardiovascular Disease | Admitting: Cardiovascular Disease

## 2016-05-18 DIAGNOSIS — R002 Palpitations: Secondary | ICD-10-CM | POA: Diagnosis not present

## 2016-05-18 DIAGNOSIS — Z8249 Family history of ischemic heart disease and other diseases of the circulatory system: Secondary | ICD-10-CM | POA: Diagnosis not present

## 2016-05-18 DIAGNOSIS — Z823 Family history of stroke: Secondary | ICD-10-CM | POA: Insufficient documentation

## 2016-05-18 DIAGNOSIS — R030 Elevated blood-pressure reading, without diagnosis of hypertension: Secondary | ICD-10-CM | POA: Diagnosis not present

## 2016-05-18 DIAGNOSIS — R072 Precordial pain: Secondary | ICD-10-CM

## 2016-05-18 DIAGNOSIS — I369 Nonrheumatic tricuspid valve disorder, unspecified: Secondary | ICD-10-CM

## 2016-05-18 DIAGNOSIS — E7800 Pure hypercholesterolemia, unspecified: Secondary | ICD-10-CM | POA: Insufficient documentation

## 2016-05-18 DIAGNOSIS — R079 Chest pain, unspecified: Secondary | ICD-10-CM | POA: Insufficient documentation

## 2016-05-18 DIAGNOSIS — Z79899 Other long term (current) drug therapy: Secondary | ICD-10-CM | POA: Insufficient documentation

## 2016-05-18 HISTORY — PX: CARDIAC CATHETERIZATION: SHX172

## 2016-05-18 LAB — ECHOCARDIOGRAM COMPLETE
E decel time: 261 msec
EERAT: 7.77
FS: 25 % — AB (ref 28–44)
Height: 62 in
IVS/LV PW RATIO, ED: 0.96
LA ID, A-P, ES: 39 mm
LADIAMINDEX: 2.32 cm/m2
LAVOL: 50 mL
LAVOLA4C: 44 mL
LAVOLIN: 29.7 mL/m2
LEFT ATRIUM END SYS DIAM: 39 mm
LVEEAVG: 7.77
LVEEMED: 7.77
LVELAT: 8.44 cm/s
LVOT area: 2.54 cm2
LVOT diameter: 18 mm
MV Dec: 261
MV pk E vel: 65.6 m/s
MVPKAVEL: 71.1 m/s
PW: 8.01 mm — AB (ref 0.6–1.1)
Reg peak vel: 242 cm/s
TDI e' lateral: 8.44
TDI e' medial: 10.2
TRMAXVEL: 242 cm/s
Weight: 2240 oz

## 2016-05-18 SURGERY — LEFT HEART CATH AND CORONARY ANGIOGRAPHY
Anesthesia: LOCAL

## 2016-05-18 MED ORDER — SODIUM CHLORIDE 0.9 % IV SOLN: 250.0000 mL | INTRAVENOUS | Status: DC | PRN

## 2016-05-18 MED ORDER — FENTANYL CITRATE (PF) 100 MCG/2ML IJ SOLN
INTRAMUSCULAR | Status: AC
  Filled 2016-05-18: qty 2

## 2016-05-18 MED ORDER — MIDAZOLAM HCL 2 MG/2ML IJ SOLN
INTRAMUSCULAR | Status: AC
  Filled 2016-05-18: qty 2

## 2016-05-18 MED ORDER — SODIUM CHLORIDE 0.9% FLUSH: 3.0000 mL | Freq: Two times a day (BID) | INTRAVENOUS | Status: DC

## 2016-05-18 MED ORDER — SODIUM CHLORIDE 0.9 % IV SOLN: INTRAVENOUS | Status: AC

## 2016-05-18 MED ORDER — ONDANSETRON HCL 4 MG/2ML IJ SOLN: 4.0000 mg | Freq: Four times a day (QID) | INTRAMUSCULAR | Status: DC | PRN

## 2016-05-18 MED ORDER — LIDOCAINE HCL (PF) 1 % IJ SOLN
INTRAMUSCULAR | Status: AC
  Filled 2016-05-18: qty 30

## 2016-05-18 MED ORDER — SODIUM CHLORIDE 0.9 % WEIGHT BASED INFUSION: 1.0000 mL/kg/h | INTRAVENOUS | Status: DC

## 2016-05-18 MED ORDER — HEPARIN SODIUM (PORCINE) 1000 UNIT/ML IJ SOLN
INTRAMUSCULAR | Status: DC | PRN
  Administered 2016-05-18: 3500 [IU] via INTRAVENOUS

## 2016-05-18 MED ORDER — ASPIRIN 81 MG PO CHEW
CHEWABLE_TABLET | ORAL | Status: AC
  Filled 2016-05-18: qty 1

## 2016-05-18 MED ORDER — HEPARIN (PORCINE) IN NACL 2-0.9 UNIT/ML-% IJ SOLN
INTRAMUSCULAR | Status: DC | PRN
  Administered 2016-05-18: 1000 mL

## 2016-05-18 MED ORDER — VERAPAMIL HCL 2.5 MG/ML IV SOLN
INTRAVENOUS | Status: DC | PRN
  Administered 2016-05-18: 09:00:00 via INTRA_ARTERIAL

## 2016-05-18 MED ORDER — MIDAZOLAM HCL 2 MG/2ML IJ SOLN
INTRAMUSCULAR | Status: DC | PRN
  Administered 2016-05-18 (×2): 1 mg via INTRAVENOUS

## 2016-05-18 MED ORDER — ACETAMINOPHEN 325 MG PO TABS: 650.0000 mg | ORAL_TABLET | ORAL | Status: DC | PRN

## 2016-05-18 MED ORDER — SODIUM CHLORIDE 0.9% FLUSH: 3.0000 mL | INTRAVENOUS | Status: DC | PRN

## 2016-05-18 MED ORDER — FENTANYL CITRATE (PF) 100 MCG/2ML IJ SOLN
INTRAMUSCULAR | Status: DC | PRN
  Administered 2016-05-18: 25 ug via INTRAVENOUS

## 2016-05-18 MED ORDER — IOPAMIDOL (ISOVUE-370) INJECTION 76%
INTRAVENOUS | Status: DC | PRN
  Administered 2016-05-18: 75 mL via INTRA_ARTERIAL

## 2016-05-18 MED ORDER — SODIUM CHLORIDE 0.9 % WEIGHT BASED INFUSION
3.0000 mL/kg/h | INTRAVENOUS | Status: DC
  Administered 2016-05-18 (×2): 3 mL/kg/h via INTRAVENOUS

## 2016-05-18 MED ORDER — HEPARIN (PORCINE) IN NACL 2-0.9 UNIT/ML-% IJ SOLN
INTRAMUSCULAR | Status: AC
  Filled 2016-05-18: qty 1000

## 2016-05-18 MED ORDER — ASPIRIN 81 MG PO CHEW
81.0000 mg | CHEWABLE_TABLET | ORAL | Status: AC
  Administered 2016-05-18: 81 mg via ORAL

## 2016-05-18 MED ORDER — IOPAMIDOL (ISOVUE-370) INJECTION 76%
INTRAVENOUS | Status: AC
  Filled 2016-05-18: qty 100

## 2016-05-18 MED ORDER — HEPARIN SODIUM (PORCINE) 1000 UNIT/ML IJ SOLN
INTRAMUSCULAR | Status: AC
  Filled 2016-05-18: qty 1

## 2016-05-18 MED ORDER — VERAPAMIL HCL 2.5 MG/ML IV SOLN
INTRAVENOUS | Status: AC
  Filled 2016-05-18: qty 2

## 2016-05-18 MED ORDER — LIDOCAINE HCL (PF) 1 % IJ SOLN
INTRAMUSCULAR | Status: DC | PRN
  Administered 2016-05-18: 5 mL

## 2016-05-18 SURGICAL SUPPLY — 11 items
CATH INFINITI 5FR ANG PIGTAIL (CATHETERS) ×1 IMPLANT
CATH OPTITORQUE TIG 4.0 5F (CATHETERS) ×1 IMPLANT
DEVICE RAD COMP TR BAND LRG (VASCULAR PRODUCTS) ×1 IMPLANT
GLIDESHEATH SLEND SS 6F .021 (SHEATH) ×1 IMPLANT
GUIDEWIRE INQWIRE 1.5J.035X260 (WIRE) IMPLANT
INQWIRE 1.5J .035X260CM (WIRE) ×2
KIT HEART LEFT (KITS) ×2 IMPLANT
PACK CARDIAC CATHETERIZATION (CUSTOM PROCEDURE TRAY) ×2 IMPLANT
SYR MEDRAD MARK V 150ML (SYRINGE) ×2 IMPLANT
TRANSDUCER W/STOPCOCK (MISCELLANEOUS) ×2 IMPLANT
TUBING CIL FLEX 10 FLL-RA (TUBING) ×2 IMPLANT

## 2016-05-18 NOTE — Discharge Instructions (Signed)

## 2016-05-18 NOTE — Interval H&P Note (Signed)
Cath Lab Visit (complete for each Cath Lab visit)  Clinical Evaluation Leading to the Procedure:   ACS: No.  Non-ACS:    Anginal Classification: CCS II  Anti-ischemic medical therapy: No Therapy  Non-Invasive Test Results: Low-risk stress test findings: cardiac mortality <1%/year  Prior CABG: No previous CABG      History and Physical Interval Note:  05/18/2016 9:10 AM  Rebecca Phillips  has presented today for surgery, with the diagnosis of cp  The various methods of treatment have been discussed with the patient and family. After consideration of risks, benefits and other options for treatment, the patient has consented to  Procedure(s): Left Heart Cath and Coronary Angiography (N/A) as a surgical intervention .  The patient's history has been reviewed, patient examined, no change in status, stable for surgery.  I have reviewed the patient's chart and labs.  Questions were answered to the patient's satisfaction.     Nicki Guadalajarahomas Evonte Prestage

## 2016-05-18 NOTE — H&P (View-Only) (Signed)
Cardiology Office Note:    Date:  05/16/2016   ID:  Rebecca Phillips, DOB 01/19/1941, MRN [id number]  PCP:  James Kim, MD  Cardiologist:  Dr. Peter Nishan   Electrophysiologist:  n/a  Referring MD: Kim, James, MD   Chief Complaint  Patient presents with  . Chest Pain   History of Present Illness:    Rebecca Phillips is a 75 y.o. female with a hx of HL.  She saw Dr. Peter Nishan in 9/17 for surgical clearance.  She noted chest pain and had an abnormal ECG.  Myoview was done and this was low risk.  She was cleared for surgery.  She was at [facility] today for her knee surgery with Dr. Bashore.  She noted significant chest pain and her surgery was cancelled.   She was referred back her for evaluation.  She is here with her husband and daughter.  She notes significant chest pain over the past several weeks.  It seems to occur more at rest than with exertion.  Although, she does have some discomfort with exertion.  She notes a lot of discomfort mainly at night.  She denies associated nausea or diaphoresis.  She had a severe episode 2 weeks ago while at rest that last several minutes.  She took 3 NTG with relief.  She has not had a severe episode like this since.  She denies assoc with meals. She denies dysphagia, odynophagia.  She denies melena, hematochezia.  She denies pleuritic chest pain or chest pain with lying supine.  She has not taken antacids for her pain.  She does have GERD and she takes Prilosec.  She denies orthopnea, PND, edema. She denies syncope.    Prior CV studies that were reviewed today include:    Myoview 02/29/16 EF 71, PACs, brief PAT - ? AFib, inferoseptal infarct, no ischemia; Low Risk  Past Medical History:  Diagnosis Date  . Allergic rhinitis   . Cystitis   . Eczema   . GERD (gastroesophageal reflux disease)   . Hyperlipidemia   . Recurrent anxiety     Past Surgical History:  Procedure Laterality Date  . COLONOSCOPY    . ESOPHAGOGASTRODUODENOSCOPY  12/13/2004   =>squamosu and cardia type mucosa with mild chronic inflammation    Current Medications: Current Meds  Medication Sig  . Cholecalciferol (VITAMIN D-3) 1000 units CAPS Take 4,000 Units by mouth daily.   . clonazePAM (KLONOPIN) 1 MG tablet Take 1 mg by mouth daily as needed for anxiety.  . Multiple Vitamins-Minerals (MULTIVITAMIN PO) Take 1 tablet by mouth daily.  . omeprazole (PRILOSEC) 20 MG capsule Take 20 mg by mouth daily.  . pravastatin (PRAVACHOL) 40 MG tablet Take 60 mg by mouth daily.   . traMADol (ULTRAM) 50 MG tablet Take 50 mg by mouth 2 (two) times daily as needed for moderate pain.   . [DISCONTINUED] CALCIUM-VITAMIN D PO Take 1 tablet by mouth daily.     Allergies:   Patient has no known allergies.   Social History   Social History  . Marital status: Married    Spouse name: N/A  . Number of children: N/A  . Years of education: N/A   Social History Main Topics  . Smoking status: Never Smoker  . Smokeless tobacco: Never Used  . Alcohol use None  . Drug use: Unknown  . Sexual activity: Not Asked   Other Topics Concern  . None   Social History Narrative  . None       Family History:  The patient's family history includes CVA in her maternal grandfather; Colon cancer in her paternal aunt and son; Healthy in her sister; Heart attack in her father and paternal grandfather; Heart disease in her brother; Hip fracture in her mother; Hypertension in her father; Osteoarthritis in her sister; Osteoporosis in her mother and sister; Other in her mother; Prostate cancer in her father; Throat cancer in her maternal uncle; Transient ischemic attack in her mother.   ROS:   Please see the history of present illness.    ROS All other systems reviewed and are negative.   EKGs/Labs/Other Test Reviewed:    EKG:  EKG is  ordered today.  The ekg ordered today demonstrates NSR, HR 62, PACs, QTc 434 ms, RSR prime V1-V2  Recent Labs: No results found for requested labs within last  8760 hours.   Recent Lipid Panel No results found for: CHOL, TRIG, HDL, CHOLHDL, VLDL, LDLCALC, LDLDIRECT   Physical Exam:    VS:  BP (!) 150/68   Pulse 62   Ht 5\' 2"  (1.575 m)   Wt 144 lb 12.8 oz (65.7 kg)   BMI 26.48 kg/m     Wt Readings from Last 3 Encounters:  05/16/16 144 lb 12.8 oz (65.7 kg)  02/29/16 142 lb (64.4 kg)  02/28/16 142 lb 12.8 oz (64.8 kg)     Physical Exam  Constitutional: She is oriented to person, place, and time. She appears well-developed and well-nourished. No distress.  HENT:  Head: Normocephalic and atraumatic.  Eyes: No scleral icterus.  Neck: No JVD present. Carotid bruit is not present.  Cardiovascular: Normal rate, regular rhythm and normal heart sounds.   No murmur heard. Pulmonary/Chest: Effort normal. She has no wheezes. She has no rales.  Abdominal: Soft. There is no tenderness.  Musculoskeletal: She exhibits no edema.  Neurological: She is alert and oriented to person, place, and time.  Skin: Skin is warm and dry.  Psychiatric: She has a normal mood and affect.    ASSESSMENT:    1. Precordial pain   2. Palpitations   3. Pure hypercholesterolemia   4. Elevated blood pressure reading    PLAN:    In order of problems listed above:  1. Chest pain - Her symptoms have typical and atypical features. She had an episode 2 weeks ago that was more concerning and she did have relief with nitroglycerin. Her recent stress test was originally read out as demonstrating inferoseptal scar. This was overall low risk. Dr. Eden EmmsNishan felt of the images looked normal. In any event, the patient continues to have symptoms. I reviewed her case today with Dr. Eden EmmsNishan. We have recommended proceeding with cardiac catheterization to better evaluate her chest symptoms. Risks and benefits of cardiac catheterization have been discussed with the patient.  These include bleeding, infection, kidney damage, stroke, heart attack, death.  The patient understands these risks  and is willing to proceed.   -  Continue aspirin 81 daily  -  Prescription given for when necessary nitroglycerin  -  Consider GI referral if cardiac catheterization normal.  2. Palpitations - She notes occasional palpitations and irregular heartbeat. There is a question of atrial fibrillation on her stress test. She is convinced that she has atrial fibrillation. Consider arranging event monitor at follow-up after cardiac catheterization.  3. HL- Continue statin.  4. Elevated blood pressure - Her blood pressures has been a bit higher than normal recently. I have asked her to keep track of her blood  pressures. Consider adding amlodipine 2.5 mg daily if blood pressure remains above target.   Medication Adjustments/Labs and Tests Ordered: Current medicines are reviewed at length with the patient today.  Concerns regarding medicines are outlined above.  Medication changes, Labs and Tests ordered today are outlined in the Patient Instructions noted below. Patient Instructions  Medication Instructions:  1. CONTINUE ASPIRIN 81 MG DAILY 2. AN RX FOR NITROGLYCERIN HAS BEEN SENT IN AND YOU HAVE BEEN ADVISED AS TO HOW AND WHEN TO USE NTG  Labwork: TODAY BMET, CBC, PT/INR (PRE CATH)  Testing/Procedures: Your physician has requested that you have a cardiac catheterization. Cardiac catheterization is used to diagnose and/or treat various heart conditions. Doctors may recommend this procedure for a number of different reasons. The most common reason is to evaluate chest pain. Chest pain can be a symptom of coronary artery disease (CAD), and cardiac catheterization can show whether plaque is narrowing or blocking your heart's arteries. This procedure is also used to evaluate the valves, as well as measure the blood flow and oxygen levels in different parts of your heart. For further information please visit [email or web address]. Please follow instruction sheet, as given.  Follow-Up: Tereso NewcomerSCOTT Shameika Speelman, [facility]  06/01/16 @ 10:45  Any Other Special Instructions Will Be Listed Below (If Applicable).  If you need a refill on your cardiac medications before your next appointment, please call your pharmacy.  Signed, Tereso NewcomerScott Kurtiss Wence, PA-C  05/16/2016 4:39 PM    [facility] [address], [place], [place]  [address] Phone: [phone]; Fax: [phone]

## 2016-05-18 NOTE — Progress Notes (Signed)
  Echocardiogram 2D Echocardiogram has been performed.  Delcie RochENNINGTON, Crayton Savarese 05/18/2016, 2:28 PM

## 2016-05-21 ENCOUNTER — Encounter (HOSPITAL_COMMUNITY): Payer: Self-pay | Admitting: Cardiovascular Disease

## 2016-05-22 ENCOUNTER — Telehealth: Payer: Self-pay | Admitting: Cardiovascular Disease

## 2016-05-22 NOTE — Telephone Encounter (Signed)
Pt's dtr Rebecca Phillips calling to see if she can ger CT results that were done at Short Stay-pls call [phone]

## 2016-05-22 NOTE — Telephone Encounter (Signed)
Patient had an echo that was done after her heart cath. Patient's daughter (DPR) wants to know the results. Sent test to Dr. Eden EmmsNishan to review. Informed patient's daughter that our office will call with results.

## 2016-05-22 NOTE — Telephone Encounter (Signed)
Called patient's daughter back with echo results. Patient's daughter verbalized understanding and had no other questions.

## 2016-05-22 NOTE — Telephone Encounter (Signed)
If looked fine EF was normal no scar infarct or aneurysm not sure where that came from

## 2016-06-01 ENCOUNTER — Ambulatory Visit: Payer: Medicare HMO | Admitting: Physician Assistant

## 2016-06-25 ENCOUNTER — Ambulatory Visit: Payer: Medicare HMO | Admitting: Physician Assistant

## 2016-06-25 NOTE — Progress Notes (Deleted)
Cardiology Office Note:    Date:  06/25/2016   ID:  Rebecca Phillips, DOB 09/02/1940, MRN [id number]  PCP:  Pearson Grippe, MD  Cardiologist:  Dr. Charlton Haws   Electrophysiologist:  n/a  Referring MD: Pearson Grippe, MD   No chief complaint on file. ***  History of Present Illness:    Rebecca Phillips is a 76 y.o. female with a hx of HTN, HL, palpitations and chest pain.  Myoview in 9/17 was low risk.  She presented to the office in 11/17 after knee surgery was cancelled due to ongoing chest pain.    Prior CV studies that were reviewed today include:    LHC 05/18/16 Normal LV function with an ejection fraction at least 55%.  There is a small possible diverticulum arising from the mid inferior wall uncertain significance.  On the LAO projection, although the mid posterior lateral wall contracted well the wall motion was different than the other walls. Normal coronary arteries with moderate size LAD, ramus intermediate, and  RCA in a right dominant system.   The AV groove circumflex is a diminutive vessel. RECOMMENDATION: A 2-D echo Doppler study will be ordered following the catheterization study.  Echo 05/18/16 EF 55 (no pseudoaneurysm seen), mild LAE, RV apex mildly dilated and heavily trabeculated  Myoview 02/29/16 EF 71, PACs, brief PAT - ? AFib, inferoseptal infarct, no ischemia; Low Risk  Past Medical History:  Diagnosis Date  . Allergic rhinitis   . Cystitis   . Eczema   . GERD (gastroesophageal reflux disease)   . Hyperlipidemia   . Recurrent anxiety     Past Surgical History:  Procedure Laterality Date  . CARDIAC CATHETERIZATION N/A 05/18/2016   Procedure: Left Heart Cath and Coronary Angiography;  Surgeon: Lennette Bihari, MD;  Location: [facility] INVASIVE CV LAB;  Service: Cardiovascular;  Laterality: N/A;  . COLONOSCOPY    . ESOPHAGOGASTRODUODENOSCOPY  12/13/2004   =>squamosu and cardia type mucosa with mild chronic inflammation    Current Medications: No outpatient prescriptions  have been marked as taking for the 06/25/16 encounter (Appointment) with Beatrice Lecher, PA-C.     Allergies:   Patient has no known allergies.   Social History   Social History  . Marital status: Married    Spouse name: N/A  . Number of children: N/A  . Years of education: N/A   Social History Main Topics  . Smoking status: Never Smoker  . Smokeless tobacco: Never Used  . Alcohol use Not on file  . Drug use: Unknown  . Sexual activity: Not on file   Other Topics Concern  . Not on file   Social History Narrative  . No narrative on file     Family History:  The patient's ***family history includes CVA in her maternal grandfather; Colon cancer in her paternal aunt and son; Healthy in her sister; Heart attack in her father and paternal grandfather; Heart disease in her brother; Hip fracture in her mother; Hypertension in her father; Osteoarthritis in her sister; Osteoporosis in her mother and sister; Other in her mother; Prostate cancer in her father; Throat cancer in her maternal uncle; Transient ischemic attack in her mother.   ROS:   Please see the history of present illness.    ROS All other systems reviewed and are negative.   EKGs/Labs/Other Test Reviewed:    EKG:  EKG is *** ordered today.  The ekg ordered today demonstrates ***  Recent Labs: 05/16/2016: BUN 12; Creat 0.76; Hemoglobin  12.3; Platelets 250; Potassium 4.3; Sodium 139   Recent Lipid Panel No results found for: CHOL, TRIG, HDL, CHOLHDL, VLDL, LDLCALC, LDLDIRECT   Physical Exam:    VS:  There were no vitals taken for this visit.    Wt Readings from Last 3 Encounters:  05/18/16 140 lb (63.5 kg)  05/16/16 144 lb 12.8 oz (65.7 kg)  02/29/16 142 lb (64.4 kg)     ***Physical Exam  ASSESSMENT:    No diagnosis found. PLAN:    In order of problems listed above:  ***   Medication Adjustments/Labs and Tests Ordered: Current medicines are reviewed at length with the patient today.  Concerns  regarding medicines are outlined above.  Medication changes, Labs and Tests ordered today are outlined in the Patient Instructions noted below. There are no Patient Instructions on file for this visit. Signed, Tereso NewcomerScott Dea Bitting, PA-C  06/25/2016 9:54 AM    [facility] [address], [place], [place]  [address] Phone: [phone]; Fax: [phone]

## 2016-07-09 ENCOUNTER — Ambulatory Visit: Payer: Medicare HMO | Admitting: Physician Assistant

## 2016-07-11 ENCOUNTER — Ambulatory Visit (INDEPENDENT_AMBULATORY_CARE_PROVIDER_SITE_OTHER): Payer: Medicare HMO

## 2016-07-11 ENCOUNTER — Ambulatory Visit (INDEPENDENT_AMBULATORY_CARE_PROVIDER_SITE_OTHER): Payer: Medicare HMO | Admitting: Physician Assistant

## 2016-07-11 ENCOUNTER — Encounter: Payer: Self-pay | Admitting: Physician Assistant

## 2016-07-11 VITALS — BP 122/70 | HR 78 | Ht 62.0 in | Wt 139.0 lb

## 2016-07-11 DIAGNOSIS — R072 Precordial pain: Secondary | ICD-10-CM

## 2016-07-11 DIAGNOSIS — K219 Gastro-esophageal reflux disease without esophagitis: Secondary | ICD-10-CM | POA: Diagnosis not present

## 2016-07-11 DIAGNOSIS — R002 Palpitations: Secondary | ICD-10-CM | POA: Diagnosis not present

## 2016-07-11 NOTE — Progress Notes (Signed)
Cardiology Office Note:    Date:  07/11/2016   ID:  Rebecca Phillips, DOB 07/02/1940, MRN [id number]  PCP:  Pearson Grippe, MD  Cardiologist:  Dr. Charlton Haws   Electrophysiologist:  n/a  Referring MD: Pearson Grippe, MD   Chief Complaint  Patient presents with  . Follow-up    chest pain; s/p LHC    History of Present Illness:    Rebecca Phillips is a 76 y.o. female with a hx of HTN, HL, palpitations and chest pain.  Myoview in 9/17 was low risk.  She presented to the office in 11/17 after knee surgery was cancelled due to ongoing chest pain.  Cardiac catheterization was performed and this demonstrated no CAD.  There was a possible small diverticulum arising from the mid inferior wall on LV gram.  A follow up echo demonstrated EF 55% and no pseudoaneurysm or diverticulum.    She returns for Cardiology follow up.  She is here with her husband and daughter.  She has not had as much chest pain.  She does note dysphagia.  She has some shortness of breath with certain activities.  She denies syncope but does have lightheadedness/near syncope at times.  She has fallen.  She has occ palpitations.  She tells me she was told she had atrial fibrillation years ago before a knee operation.  She was sent from the orthopedics office to the ED and her ECG apparently demonstrated NSR.  She has had freq PACs on ECGs here.  Prior CV studies that were reviewed today include:     LHC 05/18/16 Normal LV function with an ejection fraction at least 55%. There is a small possible diverticulum arising from the mid inferior wall uncertain significance. On the LAO projection, although the mid posterior lateral wall contracted well the wall motion was different than the other walls. Normal coronary arteries with moderate size LAD, ramus intermediate, and RCA in a right dominant system. The AV groove circumflex is a diminutive vessel. RECOMMENDATION: A 2-D echo Doppler study will be ordered following the catheterization  study.  Echo 05/18/16 EF 55 (no pseudoaneurysm or diverticulum seen), mild LAE, RV apex mildly dilated and heavily trabeculated  Myoview 02/29/16 EF 71, PACs, brief PAT - ? AFib, inferoseptal infarct, no ischemia; Low Risk  Past Medical History:  Diagnosis Date  . Allergic rhinitis   . Cystitis   . Eczema   . GERD (gastroesophageal reflux disease)   . Hyperlipidemia   . Recurrent anxiety     Past Surgical History:  Procedure Laterality Date  . CARDIAC CATHETERIZATION N/A 05/18/2016   Procedure: Left Heart Cath and Coronary Angiography;  Surgeon: Lennette Bihari, MD;  Location: [facility]VAS[facility] CV LAB;  Service: Cardiovascular;  Laterality: N/A;  . COLONOSCOPY    . ESOPHAGOGASTRODUODENOSCOPY  12/13/2004   =>squamosu and cardia type mucosa with mild chronic inflammation    Current Medications: Current Meds  Medication Sig  . aspirin EC 81 MG tablet Take 1 tablet (81 mg total) by mouth daily.  . Cholecalciferol (VITAMIN D-3) 1000 units CAPS Take 4,000 Units by mouth daily.   . clonazePAM (KLONOPIN) 1 MG tablet Take 1 mg by mouth daily as needed for anxiety.  Marland Kitchen ibuprofen (ADVIL,MOTRIN) 200 MG tablet Take 400-800 mg by mouth every 6 (six) hours as needed for headache or moderate pain.  . Multiple Vitamins-Minerals (MULTIVITAMIN PO) Take 1 tablet by mouth daily.  . nitroGLYCERIN (NITROSTAT) 0.4 MG SL tablet Place 0.4 mg under the tongue every  5 (five) minutes as needed for chest pain.  Marland Kitchen omeprazole (PRILOSEC) 20 MG capsule Take 20 mg by mouth daily.  . pravastatin (PRAVACHOL) 40 MG tablet Take 60 mg by mouth daily.   . traMADol (ULTRAM) 50 MG tablet Take 50 mg by mouth 2 (two) times daily as needed for moderate pain.      Allergies:   Patient has no known allergies.   Social History   Social History  . Marital status: Married    Spouse name: N/A  . Number of children: N/A  . Years of education: N/A   Social History Main Topics  . Smoking status: Never Smoker  . Smokeless  tobacco: Never Used  . Alcohol use None  . Drug use: Unknown  . Sexual activity: Not Asked   Other Topics Concern  . None   Social History Narrative  . None     Family History:  The patient's family history includes CVA in her maternal grandfather; Colon cancer in her paternal aunt and son; Healthy in her sister; Heart attack in her father and paternal grandfather; Heart disease in her brother; Hip fracture in her mother; Hypertension in her father; Osteoarthritis in her sister; Osteoporosis in her mother and sister; Other in her mother; Prostate cancer in her father; Throat cancer in her maternal uncle; Transient ischemic attack in her mother.   ROS:   Please see the history of present illness.    Review of Systems  Cardiovascular: Positive for chest pain and irregular heartbeat.  Neurological: Positive for dizziness and loss of balance.   All other systems reviewed and are negative.   EKGs/Labs/Other Test Reviewed:    EKG:  EKG is  ordered today.  The ekg ordered today demonstrates NSR, HR 78, LAD, PACs with 4 beat atrial run, QTc 462 ms  Recent Labs: 05/16/2016: BUN 12; Creat 0.76; Hemoglobin 12.3; Platelets 250; Potassium 4.3; Sodium 139   Recent Lipid Panel No results found for: CHOL, TRIG, HDL, CHOLHDL, VLDL, LDLCALC, LDLDIRECT   Physical Exam:    VS:  BP 122/70 (BP Location: Left Arm, Patient Position: Sitting, Cuff Size: Normal)   Pulse 78   Ht 5\' 2"  (1.575 m)   Wt 139 lb (63 kg)   BMI 25.42 kg/m     Wt Readings from Last 3 Encounters:  07/11/16 139 lb (63 kg)  05/18/16 140 lb (63.5 kg)  05/16/16 144 lb 12.8 oz (65.7 kg)     Physical Exam  Constitutional: She is oriented to person, place, and time. She appears well-developed and well-nourished. No distress.  HENT:  Head: Normocephalic and atraumatic.  Eyes: No scleral icterus.  Neck: No JVD present.  Cardiovascular: Normal rate, regular rhythm and normal heart sounds.   No murmur heard. Pulmonary/Chest:  Effort normal. She has no wheezes. She has no rales.  Abdominal: Soft. There is no tenderness.  Musculoskeletal: She exhibits no edema.  R wrist without hematoma  Neurological: She is alert and oriented to person, place, and time.  Skin: Skin is warm and dry.  Psychiatric: She has a normal mood and affect.    ASSESSMENT:    1. Precordial pain   2. Palpitations   3. Gastroesophageal reflux disease, esophagitis presence not specified    PLAN:    In order of problems listed above:  1. Chest pain -  Cardiac cath in 05/2016 demonstrated no CAD.  Her chest pain is non-cardiac.  2. Palpitations - She has PACs on ECG.  She was told  she had atrial fibrillation in the past.  CHADS2-VASc=3 (age, gender). She would need anticoagulation if atrial fibrillation is identified.  She also has spells of dizziness or near syncope.    -  Arrange event monitor  -  Consider Metoprolol Tartrate 12.5 QD prn palpitations if only PACs noted on monitor  -  She will need anticoagulation if AFib identified.  3. GERD - She notes dysphagia.  Her chest pain may be related to GERD.  I have asked her to follow up with GI.  Continue proton pump inhibitor.     Medication Adjustments/Labs and Tests Ordered: Current medicines are reviewed at length with the patient today.  Concerns regarding medicines are outlined above.  Medication changes, Labs and Tests ordered today are outlined in the Patient Instructions noted below. Patient Instructions  Medication Instructions:  No changes.  See your medication list.  Labwork: None   Testing/Procedures: Your physician has recommended that you wear an event monitor. Event monitors are medical devices that record the heart's electrical activity. Doctors most often us these monitors to diagnose arrhythmias. Arrhythmias are problems with the speed or rhythm of the heartbeat. The monitor is a small, portable device. You can wear one while you do your normal daily activities. This  is usually used to diagnose what is causing palpitations/syncope (passing out).  Follow-Up: As needed with Dr. Charlton HawsPeter Nishan depending upon results of your monitor.   Any Other Special Instructions Will Be Listed Below (If Applicable). Call your gastroenterologist to arrange follow up.  Your chest pain may be related to your stomach or esophagus.  If you need a refill on your cardiac medications before your next appointment, please call your pharmacy.   Signed, Tereso NewcomerScott Weaver, PA-C  07/11/2016 1:12 PM    [facility] [address], [place], [place]  [address] Phone: [phone]; Fax: [phone]

## 2016-07-11 NOTE — Patient Instructions (Signed)
Medication Instructions:  No changes.  See your medication list.  Labwork: None   Testing/Procedures: Your physician has recommended that you wear an event monitor. Event monitors are medical devices that record the heart's electrical activity. Doctors most often us these monitors to diagnose arrhythmias. Arrhythmias are problems with the speed or rhythm of the heartbeat. The monitor is a small, portable device. You can wear one while you do your normal daily activities. This is usually used to diagnose what is causing palpitations/syncope (passing out).  Follow-Up: As needed with Dr. Charlton HawsPeter Nishan depending upon results of your monitor.   Any Other Special Instructions Will Be Listed Below (If Applicable). Call your gastroenterologist to arrange follow up.  Your chest pain may be related to your stomach or esophagus.  If you need a refill on your cardiac medications before your next appointment, please call your pharmacy.

## 2016-08-16 ENCOUNTER — Encounter: Payer: Self-pay | Admitting: Physician Assistant

## 2016-08-16 ENCOUNTER — Telehealth: Payer: Self-pay

## 2016-08-16 DIAGNOSIS — M859 Disorder of bone density and structure, unspecified: Secondary | ICD-10-CM | POA: Diagnosis not present

## 2016-08-16 DIAGNOSIS — M8580 Other specified disorders of bone density and structure, unspecified site: Secondary | ICD-10-CM | POA: Diagnosis not present

## 2016-08-16 DIAGNOSIS — I10 Essential (primary) hypertension: Secondary | ICD-10-CM | POA: Diagnosis not present

## 2016-08-16 MED ORDER — NEBIVOLOL HCL 5 MG PO TABS: 5.0000 mg | ORAL_TABLET | Freq: Every day | ORAL | 11 refills | Status: DC

## 2016-08-16 NOTE — Telephone Encounter (Signed)
Patient aware of monitor results. Sent new medication to patient's pharmacy of choice. Will have scheduling call patient to schedule an appointment with an EP doctor. Patient verbalized understanding.

## 2016-08-16 NOTE — Telephone Encounter (Signed)
-----   Message from Wendall StadePeter C Nishan, MD sent at 08/16/2016 10:51 AM EST ----- Monitor with no PAF Frequent PAC;s Start bystolic 5 mg f/u EP to consider suppression of PAC;s with flecainide

## 2016-08-22 DIAGNOSIS — M859 Disorder of bone density and structure, unspecified: Secondary | ICD-10-CM | POA: Diagnosis not present

## 2016-08-22 DIAGNOSIS — Z5181 Encounter for therapeutic drug level monitoring: Secondary | ICD-10-CM | POA: Diagnosis not present

## 2016-08-22 DIAGNOSIS — M1990 Unspecified osteoarthritis, unspecified site: Secondary | ICD-10-CM | POA: Diagnosis not present

## 2016-08-22 DIAGNOSIS — I10 Essential (primary) hypertension: Secondary | ICD-10-CM | POA: Diagnosis not present

## 2016-08-22 DIAGNOSIS — R69 Illness, unspecified: Secondary | ICD-10-CM | POA: Diagnosis not present

## 2016-08-22 DIAGNOSIS — E7800 Pure hypercholesterolemia, unspecified: Secondary | ICD-10-CM | POA: Diagnosis not present

## 2016-08-22 DIAGNOSIS — Z0000 Encounter for general adult medical examination without abnormal findings: Secondary | ICD-10-CM | POA: Diagnosis not present

## 2016-09-10 ENCOUNTER — Ambulatory Visit (INDEPENDENT_AMBULATORY_CARE_PROVIDER_SITE_OTHER): Payer: Medicare HMO | Admitting: Internal Medicine

## 2016-09-10 ENCOUNTER — Encounter: Payer: Self-pay | Admitting: Internal Medicine

## 2016-09-10 VITALS — BP 130/70 | HR 68 | Ht 63.0 in | Wt 144.1 lb

## 2016-09-10 DIAGNOSIS — I491 Atrial premature depolarization: Secondary | ICD-10-CM | POA: Diagnosis not present

## 2016-09-10 DIAGNOSIS — R002 Palpitations: Secondary | ICD-10-CM

## 2016-09-10 MED ORDER — METOPROLOL SUCCINATE ER 25 MG PO TB24: 12.5000 mg | ORAL_TABLET | Freq: Every day | ORAL | Status: DC

## 2016-09-10 NOTE — Addendum Note (Signed)
Addended by: Dennis BastLANIER, Orville Mena F on: 09/10/2016 12:12 PM   Modules accepted: Orders

## 2016-09-10 NOTE — Progress Notes (Signed)
Electrophysiology Office Note   Date:  09/10/2016   ID:  Rebecca Phillips, DOB 1941/01/04, MRN [id number]  PCP:  Pearson GrippeJames Kim, MD  Cardiologist:  Dr Eden EmmsNishan Primary Electrophysiologist: Hillis RangeJames Zainah Steven, MD    CC: papitations   History of Present Illness: Rebecca Phillips is a 76 y.o. female who presents today for electrophysiology evaluation.   The patient is referred by Tereso NewcomerScott Weaver and Dr Eden EmmsNishan for further evaluation of palpitations and atrial arrhythmias.  The patient reports having an episode of atrial fibrillation after knee surgery 09/2009.  She has rare palpitations which she describes as "fluttering".  She states "I dont like to go to doctors so I dont really pay attention to it".    She recently was planned for knee surgery.  She had chest pain and her knee surgery was cancelled.  She subsequently underwent myoview which revealed no ischemia and was felt to be low risk.  She was noted to have PACs and nonsustained atach during the study.    Because of her PACs on telemetry with her myoview, she had an event monitor placed.  This documented frequent PACs and nonsustained ectopy atrial activity.  She did not have any sustained afib.    She has occasional fatigue and sleepiness.  She snores.  She states that Dr Selena BattenKim has "been after her" for years to have a sleep study but she has declined.  Today, she denies symptoms of exertional chest pain, shortness of breath, orthopnea, PND, lower extremity edema, claudication, dizziness, presyncope, syncope, bleeding, or neurologic sequela. The patient is tolerating medications without difficulties and is otherwise without complaint today.    Past Medical History:  Diagnosis Date  . Allergic rhinitis   . Cystitis   . DJD (degenerative joint disease)   . Eczema   . GERD (gastroesophageal reflux disease)   . Hyperlipidemia   . PAC (premature atrial contraction)    Event monitor 2/18: freq PACs, atrial runs; no AF >> referred to EP   . Recurrent  anxiety    Past Surgical History:  Procedure Laterality Date  . CARDIAC CATHETERIZATION N/A 05/18/2016   Procedure: Left Heart Cath and Coronary Angiography;  Surgeon: Lennette Biharihomas A Kelly, MD;  [facility];  Service: Cardiovascular;  Laterality: N/A;  . COLONOSCOPY    . ESOPHAGOGASTRODUODENOSCOPY  12/13/2004   =>squamosu and cardia type mucosa with mild chronic inflammation     Current Outpatient Prescriptions  Medication Sig Dispense Refill  . aspirin EC 81 MG tablet Take 1 tablet (81 mg total) by mouth daily.    . Cholecalciferol (VITAMIN D-3) 1000 units CAPS Take 4,000 Units by mouth daily.     . clonazePAM (KLONOPIN) 1 MG tablet Take 1 mg by mouth daily as needed for anxiety.    Marland Kitchen. ibuprofen (ADVIL,MOTRIN) 200 MG tablet Take 400-800 mg by mouth every 6 (six) hours as needed for headache or moderate pain.    . Multiple Vitamins-Minerals (MULTIVITAMIN PO) Take 1 tablet by mouth daily.    . nitroGLYCERIN (NITROSTAT) 0.4 MG SL tablet Place 0.4 mg under the tongue every 5 (five) minutes as needed for chest pain.    Marland Kitchen. omeprazole (PRILOSEC) 20 MG capsule Take 20 mg by mouth daily.    . pravastatin (PRAVACHOL) 40 MG tablet Take 60 mg by mouth daily.     . traMADol (ULTRAM) 50 MG tablet Take 50 mg by mouth 2 (two) times daily as needed for moderate pain.      No  current facility-administered medications for this visit.     Allergies:   Patient has no known allergies.   Social History:  The patient  reports that she has never smoked. She has never used smokeless tobacco. She reports that she does not drink alcohol or use drugs.   Family History:  The patient's  family history includes CVA in her maternal grandfather; Colon cancer in her paternal aunt and son; Healthy in her sister; Heart attack in her father and paternal grandfather; Heart disease in her brother; Hip fracture in her mother; Hypertension in her father; Osteoarthritis in her sister; Osteoporosis in her mother and  sister; Other in her mother; Prostate cancer in her father; Throat cancer in her maternal uncle; Transient ischemic attack in her mother.    ROS:  Please see the history of present illness.   All other systems are personally reviewed and negative.    PHYSICAL EXAM: VS:  BP 130/70   Pulse 68   Ht 5\' 3"  (1.6 m)   Wt 144 lb 2 oz (65.4 kg)   SpO2 96%   BMI 25.53 kg/m  , BMI Body mass index is 25.53 kg/m. GEN: Well nourished, well developed, in no acute distress  HEENT: normal  Neck: no JVD, carotid bruits, or masses Cardiac: RRR; no murmurs, rubs, or gallops,no edema  Respiratory:  clear to auscultation bilaterally, normal work of breathing GI: soft, nontender, nondistended, + BS MS: no deformity or atrophy  Skin: warm and dry  Neuro:  Strength and sensation are intact Psych: euthymic mood, full affect  EKG:  EKGs are revoewed   Recent Labs: 05/16/2016: BUN 12; Creat 0.76; Hemoglobin 12.3; Platelets 250; Potassium 4.3; Sodium 139  personally reviewed   Lipid Panel  No results found for: CHOL, TRIG, HDL, CHOLHDL, VLDL, LDLCALC, LDLDIRECT personally reviewed   Wt Readings from Last 3 Encounters:  09/10/16 144 lb 2 oz (65.4 kg)  07/11/16 139 lb (63 kg)  05/18/16 140 lb (63.5 kg)    Other studies personally reviewed: Additional studies/ records that were reviewed today include: prior office notes,  Echo, myoview, event monitor Review of the above records today demonstrates: as above   ASSESSMENT AND PLAN:  1.  Premature atrial contractions, ectopic atrial activity without sustained afib documented The patient has very frequent atrial ectopy on her recent event monitor but does not have sustained afib.  She was prescribed metoprolol by Tereso Newcomer which she thinks may have "helped a little".    As she has not had any documented sustained afib, I do not feel that she warrants anticoagulation at this time.  We did talk about consideration of an implantable loop recorder to  monitor her heart for a prolonged period.  She is very clear that she is not very interested in further medicines or procedures.  She states "I just dont like doctors.  I believe Ill be fine on my own."  Given her preferences, I think that we should continue her current regimen.   Follow-up with Dr Eden Emms Tereso Newcomer as scheduled I will see as needed going forward  Current medicines are reviewed at length with the patient today.   The patient does not have concerns regarding her medicines.  The following changes were made today:  none   Signed, Hillis Range, MD  09/10/2016 11:51 AM     [facility] [address] [place] [address] [phone] (office) [phone] (fax)

## 2016-09-10 NOTE — Patient Instructions (Signed)
Medication Instructions:  Your physician recommends that you continue on your current medications as directed. Please refer to the Current Medication list given to you today.  Labwork: None ordered.  Testing/Procedures: None ordered.  Follow-Up: Your physician recommends that you schedule a follow-up appointment as needed.   Any Other Special Instructions Will Be Listed Below (If Applicable).     If you need a refill on your cardiac medications before your next appointment, please call your pharmacy.   

## 2016-10-01 DIAGNOSIS — H25813 Combined forms of age-related cataract, bilateral: Secondary | ICD-10-CM | POA: Diagnosis not present

## 2016-10-18 DIAGNOSIS — E7800 Pure hypercholesterolemia, unspecified: Secondary | ICD-10-CM | POA: Diagnosis not present

## 2016-10-18 DIAGNOSIS — Z79899 Other long term (current) drug therapy: Secondary | ICD-10-CM | POA: Diagnosis not present

## 2016-10-18 DIAGNOSIS — Z5181 Encounter for therapeutic drug level monitoring: Secondary | ICD-10-CM | POA: Diagnosis not present

## 2016-10-18 DIAGNOSIS — R69 Illness, unspecified: Secondary | ICD-10-CM | POA: Diagnosis not present

## 2016-10-18 DIAGNOSIS — R413 Other amnesia: Secondary | ICD-10-CM | POA: Diagnosis not present

## 2016-10-19 ENCOUNTER — Other Ambulatory Visit: Payer: Self-pay | Admitting: Internal Medicine

## 2016-10-19 DIAGNOSIS — R413 Other amnesia: Secondary | ICD-10-CM

## 2016-10-29 ENCOUNTER — Ambulatory Visit
Admission: RE | Admit: 2016-10-29 | Discharge: 2016-10-29 | Disposition: A | Discharge status: Home / Self Care | Payer: Medicare HMO | Source: Ambulatory Visit | Attending: Internal Medicine | Admitting: Internal Medicine

## 2016-10-29 DIAGNOSIS — R413 Other amnesia: Secondary | ICD-10-CM | POA: Diagnosis not present

## 2016-11-13 DIAGNOSIS — I10 Essential (primary) hypertension: Secondary | ICD-10-CM | POA: Diagnosis not present

## 2016-11-13 DIAGNOSIS — R413 Other amnesia: Secondary | ICD-10-CM | POA: Diagnosis not present

## 2016-11-13 DIAGNOSIS — E7800 Pure hypercholesterolemia, unspecified: Secondary | ICD-10-CM | POA: Diagnosis not present

## 2016-11-13 DIAGNOSIS — E538 Deficiency of other specified B group vitamins: Secondary | ICD-10-CM | POA: Diagnosis not present

## 2016-11-22 DIAGNOSIS — M1711 Unilateral primary osteoarthritis, right knee: Secondary | ICD-10-CM | POA: Diagnosis not present

## 2016-12-03 DIAGNOSIS — H2511 Age-related nuclear cataract, right eye: Secondary | ICD-10-CM | POA: Diagnosis not present

## 2016-12-03 DIAGNOSIS — H2512 Age-related nuclear cataract, left eye: Secondary | ICD-10-CM | POA: Diagnosis not present

## 2016-12-03 DIAGNOSIS — H1852 Epithelial (juvenile) corneal dystrophy: Secondary | ICD-10-CM | POA: Diagnosis not present

## 2016-12-03 DIAGNOSIS — H02831 Dermatochalasis of right upper eyelid: Secondary | ICD-10-CM | POA: Diagnosis not present

## 2016-12-06 DIAGNOSIS — H2512 Age-related nuclear cataract, left eye: Secondary | ICD-10-CM | POA: Diagnosis not present

## 2016-12-06 DIAGNOSIS — H25812 Combined forms of age-related cataract, left eye: Secondary | ICD-10-CM | POA: Diagnosis not present

## 2016-12-11 DIAGNOSIS — R413 Other amnesia: Secondary | ICD-10-CM | POA: Diagnosis not present

## 2016-12-11 DIAGNOSIS — E7800 Pure hypercholesterolemia, unspecified: Secondary | ICD-10-CM | POA: Diagnosis not present

## 2016-12-11 DIAGNOSIS — I10 Essential (primary) hypertension: Secondary | ICD-10-CM | POA: Diagnosis not present

## 2016-12-11 DIAGNOSIS — E538 Deficiency of other specified B group vitamins: Secondary | ICD-10-CM | POA: Diagnosis not present

## 2017-01-03 DIAGNOSIS — H2511 Age-related nuclear cataract, right eye: Secondary | ICD-10-CM | POA: Diagnosis not present

## 2017-02-26 DIAGNOSIS — Z5181 Encounter for therapeutic drug level monitoring: Secondary | ICD-10-CM | POA: Diagnosis not present

## 2017-02-26 DIAGNOSIS — I10 Essential (primary) hypertension: Secondary | ICD-10-CM | POA: Diagnosis not present

## 2017-02-26 DIAGNOSIS — E538 Deficiency of other specified B group vitamins: Secondary | ICD-10-CM | POA: Diagnosis not present

## 2017-02-26 DIAGNOSIS — E559 Vitamin D deficiency, unspecified: Secondary | ICD-10-CM | POA: Diagnosis not present

## 2017-02-28 DIAGNOSIS — Z0000 Encounter for general adult medical examination without abnormal findings: Secondary | ICD-10-CM | POA: Diagnosis not present

## 2017-02-28 DIAGNOSIS — E538 Deficiency of other specified B group vitamins: Secondary | ICD-10-CM | POA: Diagnosis not present

## 2017-02-28 DIAGNOSIS — I10 Essential (primary) hypertension: Secondary | ICD-10-CM | POA: Diagnosis not present

## 2017-02-28 DIAGNOSIS — E7800 Pure hypercholesterolemia, unspecified: Secondary | ICD-10-CM | POA: Diagnosis not present

## 2017-03-07 DIAGNOSIS — H35373 Puckering of macula, bilateral: Secondary | ICD-10-CM | POA: Diagnosis not present

## 2017-03-07 DIAGNOSIS — Z961 Presence of intraocular lens: Secondary | ICD-10-CM | POA: Diagnosis not present

## 2017-03-07 DIAGNOSIS — H532 Diplopia: Secondary | ICD-10-CM | POA: Diagnosis not present

## 2017-04-03 DIAGNOSIS — H25811 Combined forms of age-related cataract, right eye: Secondary | ICD-10-CM | POA: Diagnosis not present

## 2017-04-09 DIAGNOSIS — H532 Diplopia: Secondary | ICD-10-CM | POA: Diagnosis not present

## 2017-04-09 DIAGNOSIS — H1852 Epithelial (juvenile) corneal dystrophy: Secondary | ICD-10-CM | POA: Diagnosis not present

## 2017-04-16 ENCOUNTER — Other Ambulatory Visit: Payer: Self-pay

## 2017-04-16 MED ORDER — METOPROLOL SUCCINATE ER 25 MG PO TB24: 12.5000 mg | ORAL_TABLET | Freq: Every day | ORAL | 1 refills | Status: DC

## 2017-04-30 DIAGNOSIS — H1852 Epithelial (juvenile) corneal dystrophy: Secondary | ICD-10-CM | POA: Diagnosis not present

## 2017-06-06 DIAGNOSIS — R52 Pain, unspecified: Secondary | ICD-10-CM | POA: Diagnosis not present

## 2017-06-06 DIAGNOSIS — M1711 Unilateral primary osteoarthritis, right knee: Secondary | ICD-10-CM | POA: Diagnosis not present

## 2017-07-02 DIAGNOSIS — H1852 Epithelial (juvenile) corneal dystrophy: Secondary | ICD-10-CM | POA: Diagnosis not present

## 2017-08-27 DIAGNOSIS — L03213 Periorbital cellulitis: Secondary | ICD-10-CM | POA: Diagnosis not present

## 2017-08-27 DIAGNOSIS — H0014 Chalazion left upper eyelid: Secondary | ICD-10-CM | POA: Diagnosis not present

## 2017-09-03 DIAGNOSIS — L03213 Periorbital cellulitis: Secondary | ICD-10-CM | POA: Diagnosis not present

## 2017-09-17 DIAGNOSIS — H0014 Chalazion left upper eyelid: Secondary | ICD-10-CM | POA: Diagnosis not present

## 2017-09-22 ENCOUNTER — Other Ambulatory Visit: Payer: Self-pay | Admitting: Internal Medicine

## 2017-10-21 DIAGNOSIS — M545 Low back pain: Secondary | ICD-10-CM | POA: Diagnosis not present

## 2017-10-21 DIAGNOSIS — I10 Essential (primary) hypertension: Secondary | ICD-10-CM | POA: Diagnosis not present

## 2017-10-21 DIAGNOSIS — M5441 Lumbago with sciatica, right side: Secondary | ICD-10-CM | POA: Diagnosis not present

## 2017-10-21 DIAGNOSIS — M1990 Unspecified osteoarthritis, unspecified site: Secondary | ICD-10-CM | POA: Diagnosis not present

## 2017-10-24 ENCOUNTER — Other Ambulatory Visit: Payer: Self-pay | Admitting: Cardiovascular Disease

## 2017-11-04 DIAGNOSIS — M1711 Unilateral primary osteoarthritis, right knee: Secondary | ICD-10-CM | POA: Diagnosis not present

## 2017-11-05 ENCOUNTER — Other Ambulatory Visit: Payer: Self-pay | Admitting: Cardiovascular Disease

## 2017-11-06 ENCOUNTER — Other Ambulatory Visit: Payer: Self-pay | Admitting: Cardiovascular Disease

## 2017-11-18 DIAGNOSIS — E7800 Pure hypercholesterolemia, unspecified: Secondary | ICD-10-CM | POA: Diagnosis not present

## 2017-11-18 DIAGNOSIS — Z79899 Other long term (current) drug therapy: Secondary | ICD-10-CM | POA: Diagnosis not present

## 2017-11-18 DIAGNOSIS — E538 Deficiency of other specified B group vitamins: Secondary | ICD-10-CM | POA: Diagnosis not present

## 2017-11-18 DIAGNOSIS — I10 Essential (primary) hypertension: Secondary | ICD-10-CM | POA: Diagnosis not present

## 2017-11-18 DIAGNOSIS — M5441 Lumbago with sciatica, right side: Secondary | ICD-10-CM | POA: Diagnosis not present

## 2017-11-18 DIAGNOSIS — Z5181 Encounter for therapeutic drug level monitoring: Secondary | ICD-10-CM | POA: Diagnosis not present

## 2017-11-18 DIAGNOSIS — E559 Vitamin D deficiency, unspecified: Secondary | ICD-10-CM | POA: Diagnosis not present

## 2017-12-03 DIAGNOSIS — R3915 Urgency of urination: Secondary | ICD-10-CM | POA: Diagnosis not present

## 2018-02-06 DIAGNOSIS — M7918 Myalgia, other site: Secondary | ICD-10-CM | POA: Diagnosis not present

## 2018-02-10 DIAGNOSIS — M1711 Unilateral primary osteoarthritis, right knee: Secondary | ICD-10-CM | POA: Diagnosis not present

## 2018-02-13 IMAGING — NM NM MISC PROCEDURE
6 series · 36 of 36 positions shown · non-contrast
Comparison: none

[Series 1: rest · 6.51mm/px · 6 of 64 frames shown]
[frame 6/64]
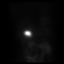
[frame 16/64]
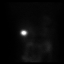
[frame 27/64]
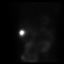
[frame 38/64]
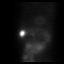
[frame 48/64]
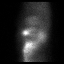
[frame 59/64]
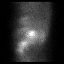

[Series 1: wbr_r-proj_st rest · 6.51mm/px · 6 of 64 frames shown]
[frame 6/64]
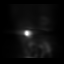
[frame 16/64]
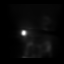
[frame 27/64]
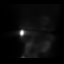
[frame 38/64]
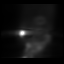
[frame 48/64]
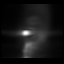
[frame 59/64]
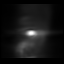

[Series 2: stress · 6.51mm/px · 6 of 512 frames shown (1 of 2)]
[frame 43/512]
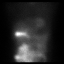
[frame 128/512]
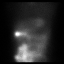
[frame 214/512]
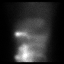
[frame 299/512]
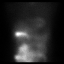
[frame 384/512]
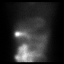
[frame 470/512]
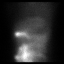

[Series 2: stress · 6.51mm/px · 6 of 64 frames shown (2 of 2)]
[frame 6/64]
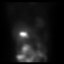
[frame 16/64]
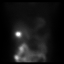
[frame 27/64]
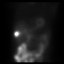
[frame 38/64]
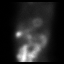
[frame 48/64]
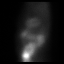
[frame 59/64]
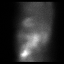

[Series 2: wbr_s-proj_st stress · 6.51mm/px · 6 of 64 frames shown (1 of 2)]
[frame 6/64]
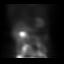
[frame 16/64]
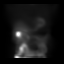
[frame 27/64]
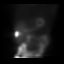
[frame 38/64]
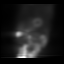
[frame 48/64]
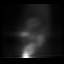
[frame 59/64]
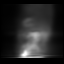

[Series 2: wbr_s-proj_st stress · 6.51mm/px · 6 of 512 frames shown (2 of 2)]
[frame 43/512]
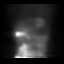
[frame 128/512]
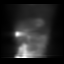
[frame 214/512]
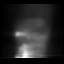
[frame 299/512]
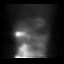
[frame 384/512]
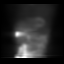
[frame 470/512]
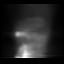

[36 of 36 positions shown; findings below may reference images not displayed]

Canned report from images found in remote index.

Refer to host system for actual result text.

## 2018-02-18 DIAGNOSIS — R197 Diarrhea, unspecified: Secondary | ICD-10-CM | POA: Diagnosis not present

## 2018-02-18 DIAGNOSIS — R413 Other amnesia: Secondary | ICD-10-CM | POA: Diagnosis not present

## 2018-02-18 DIAGNOSIS — G2581 Restless legs syndrome: Secondary | ICD-10-CM | POA: Diagnosis not present

## 2018-02-18 DIAGNOSIS — Z23 Encounter for immunization: Secondary | ICD-10-CM | POA: Diagnosis not present

## 2018-05-12 DIAGNOSIS — M1711 Unilateral primary osteoarthritis, right knee: Secondary | ICD-10-CM | POA: Diagnosis not present

## 2018-05-20 DIAGNOSIS — I10 Essential (primary) hypertension: Secondary | ICD-10-CM | POA: Diagnosis not present

## 2018-05-20 DIAGNOSIS — E538 Deficiency of other specified B group vitamins: Secondary | ICD-10-CM | POA: Diagnosis not present

## 2018-05-20 DIAGNOSIS — E559 Vitamin D deficiency, unspecified: Secondary | ICD-10-CM | POA: Diagnosis not present

## 2018-05-20 DIAGNOSIS — Z79899 Other long term (current) drug therapy: Secondary | ICD-10-CM | POA: Diagnosis not present

## 2018-05-29 DIAGNOSIS — I10 Essential (primary) hypertension: Secondary | ICD-10-CM | POA: Diagnosis not present

## 2018-05-29 DIAGNOSIS — R69 Illness, unspecified: Secondary | ICD-10-CM | POA: Diagnosis not present

## 2018-05-29 DIAGNOSIS — M1990 Unspecified osteoarthritis, unspecified site: Secondary | ICD-10-CM | POA: Diagnosis not present

## 2018-05-29 DIAGNOSIS — E7800 Pure hypercholesterolemia, unspecified: Secondary | ICD-10-CM | POA: Diagnosis not present

## 2018-07-22 ENCOUNTER — Ambulatory Visit: Payer: Medicare HMO | Admitting: Podiatry

## 2018-07-22 DIAGNOSIS — M205X2 Other deformities of toe(s) (acquired), left foot: Secondary | ICD-10-CM

## 2018-07-22 DIAGNOSIS — M2042 Other hammer toe(s) (acquired), left foot: Secondary | ICD-10-CM

## 2018-07-22 DIAGNOSIS — Q6689 Other  specified congenital deformities of feet: Secondary | ICD-10-CM | POA: Diagnosis not present

## 2018-08-16 NOTE — Progress Notes (Signed)
  Subjective:  Patient ID: Rebecca Phillips, female    DOB: 12/25/1940,  MRN: [id number]  Chief Complaint  Patient presents with  . Callouses    L 5th toe lateral side x couple months; very tender -no injury Tx: none    78 y.o. female presents with the above complaint. Above history confirmed with patient.  Review of Systems: Negative except as noted in the HPI. Denies N/V/F/Ch.  Past Medical History:  Diagnosis Date  . Allergic rhinitis   . Cystitis   . DJD (degenerative joint disease)   . Eczema   . GERD (gastroesophageal reflux disease)   . Hyperlipidemia   . PAC (premature atrial contraction)    Event monitor 2/18: freq PACs, atrial runs; no AF >> referred to EP   . Paroxysmal atrial fibrillation (HCC) 09/2009   single episode after knee surgery  . Recurrent anxiety     Current Outpatient Medications:  .  aspirin EC 81 MG tablet, Take 1 tablet (81 mg total) by mouth daily., Disp: , Rfl:  .  Cholecalciferol (VITAMIN D-3) 1000 units CAPS, Take 4,000 Units by mouth daily. , Disp: , Rfl:  .  clonazePAM (KLONOPIN) 1 MG tablet, Take 1 mg by mouth daily as needed for anxiety., Disp: , Rfl:  .  ibuprofen (ADVIL,MOTRIN) 200 MG tablet, Take 400-800 mg by mouth every 6 (six) hours as needed for headache or moderate pain., Disp: , Rfl:  .  metoprolol succinate (TOPROL-XL) 25 MG 24 hr tablet, Take 0.5 tablets (12.5 mg total) by mouth daily. Please call and schedule an appt for more refills 3rd and final attempt., Disp: 8 tablet, Rfl: 0 .  Multiple Vitamins-Minerals (MULTIVITAMIN PO), Take 1 tablet by mouth daily., Disp: , Rfl:  .  nitroGLYCERIN (NITROSTAT) 0.4 MG SL tablet, Place 0.4 mg under the tongue every 5 (five) minutes as needed for chest pain., Disp: , Rfl:  .  omeprazole (PRILOSEC) 20 MG capsule, Take 20 mg by mouth daily., Disp: , Rfl:  .  pravastatin (PRAVACHOL) 40 MG tablet, Take 60 mg by mouth daily. , Disp: , Rfl:  .  traMADol (ULTRAM) 50 MG tablet, Take 50 mg by mouth 2 (two)  times daily as needed for moderate pain. , Disp: , Rfl:   Social History   Tobacco Use  Smoking Status Never Smoker  Smokeless Tobacco Never Used    No Known Allergies Objective:  There were no vitals filed for this visit. There is no height or weight on file to calculate BMI. Constitutional Well developed. Well nourished.  Vascular Dorsalis pedis pulses palpable bilaterally. Posterior tibial pulses palpable bilaterally. Capillary refill normal to all digits.  No cyanosis or clubbing noted. Pedal hair growth normal.  Neurologic Normal speech. Oriented to person, place, and time. Epicritic sensation to light touch grossly present bilaterally.  Dermatologic Nails normal Skin hyperkeratosis left fifth toe  Orthopedic: Normal joint ROM without pain or crepitus bilaterally. No visible deformities. No bony tenderness.   Radiographs: None Assessment:   1. Hammertoe of left foot   2. Acquired adductovarus rotation of toe, left   3. Curly toe    Plan:  Patient was evaluated and treated and all questions answered.  Hammertoe left foot with hyperkeratosis, curly toe deformity -Lesion courtesy debrided for patient. -Discussed possible surgical correction of the hammertoe.  Patient not interested at this time.  No follow-ups on file.

## 2018-08-25 DIAGNOSIS — E559 Vitamin D deficiency, unspecified: Secondary | ICD-10-CM | POA: Diagnosis not present

## 2018-08-25 DIAGNOSIS — E7800 Pure hypercholesterolemia, unspecified: Secondary | ICD-10-CM | POA: Diagnosis not present

## 2018-08-25 DIAGNOSIS — I10 Essential (primary) hypertension: Secondary | ICD-10-CM | POA: Diagnosis not present

## 2018-08-25 DIAGNOSIS — Z79899 Other long term (current) drug therapy: Secondary | ICD-10-CM | POA: Diagnosis not present

## 2018-08-25 DIAGNOSIS — M859 Disorder of bone density and structure, unspecified: Secondary | ICD-10-CM | POA: Diagnosis not present

## 2018-08-28 DIAGNOSIS — I10 Essential (primary) hypertension: Secondary | ICD-10-CM | POA: Diagnosis not present

## 2018-08-28 DIAGNOSIS — R413 Other amnesia: Secondary | ICD-10-CM | POA: Diagnosis not present

## 2018-08-28 DIAGNOSIS — H9319 Tinnitus, unspecified ear: Secondary | ICD-10-CM | POA: Diagnosis not present

## 2018-08-28 DIAGNOSIS — E7800 Pure hypercholesterolemia, unspecified: Secondary | ICD-10-CM | POA: Diagnosis not present

## 2018-08-28 DIAGNOSIS — E538 Deficiency of other specified B group vitamins: Secondary | ICD-10-CM | POA: Diagnosis not present

## 2018-08-28 DIAGNOSIS — R69 Illness, unspecified: Secondary | ICD-10-CM | POA: Diagnosis not present

## 2018-08-28 DIAGNOSIS — M1990 Unspecified osteoarthritis, unspecified site: Secondary | ICD-10-CM | POA: Diagnosis not present

## 2018-08-28 DIAGNOSIS — E559 Vitamin D deficiency, unspecified: Secondary | ICD-10-CM | POA: Diagnosis not present

## 2018-09-02 ENCOUNTER — Ambulatory Visit: Payer: Medicare HMO | Admitting: Podiatry

## 2018-12-11 DIAGNOSIS — M1711 Unilateral primary osteoarthritis, right knee: Secondary | ICD-10-CM | POA: Diagnosis not present

## 2019-03-19 DIAGNOSIS — Z23 Encounter for immunization: Secondary | ICD-10-CM | POA: Diagnosis not present

## 2019-03-19 DIAGNOSIS — I10 Essential (primary) hypertension: Secondary | ICD-10-CM | POA: Diagnosis not present

## 2019-03-19 DIAGNOSIS — E559 Vitamin D deficiency, unspecified: Secondary | ICD-10-CM | POA: Diagnosis not present

## 2019-03-19 DIAGNOSIS — E785 Hyperlipidemia, unspecified: Secondary | ICD-10-CM | POA: Diagnosis not present

## 2019-03-19 DIAGNOSIS — R413 Other amnesia: Secondary | ICD-10-CM | POA: Diagnosis not present

## 2019-03-19 DIAGNOSIS — E538 Deficiency of other specified B group vitamins: Secondary | ICD-10-CM | POA: Diagnosis not present

## 2019-03-19 DIAGNOSIS — E039 Hypothyroidism, unspecified: Secondary | ICD-10-CM | POA: Diagnosis not present

## 2019-03-19 DIAGNOSIS — E7800 Pure hypercholesterolemia, unspecified: Secondary | ICD-10-CM | POA: Diagnosis not present

## 2019-05-11 DIAGNOSIS — M1711 Unilateral primary osteoarthritis, right knee: Secondary | ICD-10-CM | POA: Diagnosis not present

## 2019-08-10 DIAGNOSIS — M1711 Unilateral primary osteoarthritis, right knee: Secondary | ICD-10-CM | POA: Diagnosis not present

## 2019-08-16 ENCOUNTER — Ambulatory Visit: Payer: Medicare HMO | Attending: Internal Medicine

## 2019-08-16 DIAGNOSIS — Z23 Encounter for immunization: Secondary | ICD-10-CM

## 2019-08-16 NOTE — Progress Notes (Signed)
   Covid-19 Vaccination Clinic  Name:  YOCELINE BAZAR    MRN: [id number] DOB: 01-Jun-1941  08/16/2019  Ms. Tanzi was observed post Covid-19 immunization for 15 minutes without incidence. She was provided with Vaccine Information Sheet and instruction to access the V-Safe system.   Ms. Salahuddin was instructed to call 911 with any severe reactions post vaccine: Marland Kitchen Difficulty breathing  . Swelling of your face and throat  . A fast heartbeat  . A bad rash all over your body  . Dizziness and weakness    Immunizations Administered    Name Date Dose VIS Date Route   Pfizer COVID-19 Vaccine 08/16/2019  3:36 PM 0.3 mL 05/29/2019 Intramuscular   Manufacturer: [employer], [employer]   Lot: [id number]   NDC: [address]-6

## 2019-09-08 DIAGNOSIS — G8929 Other chronic pain: Secondary | ICD-10-CM | POA: Diagnosis not present

## 2019-09-08 DIAGNOSIS — M7918 Myalgia, other site: Secondary | ICD-10-CM | POA: Diagnosis not present

## 2019-09-08 DIAGNOSIS — M47812 Spondylosis without myelopathy or radiculopathy, cervical region: Secondary | ICD-10-CM | POA: Diagnosis not present

## 2019-09-08 DIAGNOSIS — M5031 Other cervical disc degeneration,  high cervical region: Secondary | ICD-10-CM | POA: Diagnosis not present

## 2019-09-08 DIAGNOSIS — M542 Cervicalgia: Secondary | ICD-10-CM | POA: Diagnosis not present

## 2019-09-15 ENCOUNTER — Ambulatory Visit: Payer: Medicare HMO | Attending: Internal Medicine

## 2019-09-15 DIAGNOSIS — Z23 Encounter for immunization: Secondary | ICD-10-CM

## 2019-09-15 NOTE — Progress Notes (Signed)
   Covid-19 Vaccination Clinic  Name:  [name]    MRN: [id number] DOB: 1940-09-12  09/15/2019  [name] was observed post Covid-19 immunization for 15 minutes without incident. She was provided with Vaccine Information Sheet and instruction to access the V-Safe system.   Ms. Base was instructed to call 911 with any severe reactions post vaccine: Marland Kitchen Difficulty breathing  . Swelling of face and throat  . A fast heartbeat  . A bad rash all over body  . Dizziness and weakness   Immunizations Administered    Name Date Dose VIS Date Route   Pfizer COVID-19 Vaccine 09/15/2019  1:56 PM 0.3 mL 05/29/2019 Intramuscular   Manufacturer: [employer], [employer]   Lot: [id number]   NDC: [address]-6

## 2019-09-17 DIAGNOSIS — E559 Vitamin D deficiency, unspecified: Secondary | ICD-10-CM | POA: Diagnosis not present

## 2019-09-17 DIAGNOSIS — R413 Other amnesia: Secondary | ICD-10-CM | POA: Diagnosis not present

## 2019-09-17 DIAGNOSIS — E538 Deficiency of other specified B group vitamins: Secondary | ICD-10-CM | POA: Diagnosis not present

## 2019-09-17 DIAGNOSIS — I10 Essential (primary) hypertension: Secondary | ICD-10-CM | POA: Diagnosis not present

## 2019-12-28 DIAGNOSIS — M1711 Unilateral primary osteoarthritis, right knee: Secondary | ICD-10-CM | POA: Diagnosis not present

## 2019-12-28 DIAGNOSIS — I872 Venous insufficiency (chronic) (peripheral): Secondary | ICD-10-CM | POA: Diagnosis not present

## 2019-12-28 DIAGNOSIS — M79671 Pain in right foot: Secondary | ICD-10-CM | POA: Diagnosis not present

## 2019-12-28 DIAGNOSIS — M25571 Pain in right ankle and joints of right foot: Secondary | ICD-10-CM | POA: Diagnosis not present

## 2019-12-28 DIAGNOSIS — M25561 Pain in right knee: Secondary | ICD-10-CM | POA: Diagnosis not present

## 2020-01-05 DIAGNOSIS — Z79899 Other long term (current) drug therapy: Secondary | ICD-10-CM | POA: Diagnosis not present

## 2020-01-05 DIAGNOSIS — E538 Deficiency of other specified B group vitamins: Secondary | ICD-10-CM | POA: Diagnosis not present

## 2020-01-05 DIAGNOSIS — E559 Vitamin D deficiency, unspecified: Secondary | ICD-10-CM | POA: Diagnosis not present

## 2020-01-05 DIAGNOSIS — Z0000 Encounter for general adult medical examination without abnormal findings: Secondary | ICD-10-CM | POA: Diagnosis not present

## 2020-01-05 DIAGNOSIS — E7800 Pure hypercholesterolemia, unspecified: Secondary | ICD-10-CM | POA: Diagnosis not present

## 2020-01-05 DIAGNOSIS — I10 Essential (primary) hypertension: Secondary | ICD-10-CM | POA: Diagnosis not present

## 2020-01-25 DIAGNOSIS — H9319 Tinnitus, unspecified ear: Secondary | ICD-10-CM | POA: Diagnosis not present

## 2020-01-25 DIAGNOSIS — I10 Essential (primary) hypertension: Secondary | ICD-10-CM | POA: Diagnosis not present

## 2020-01-25 DIAGNOSIS — G2581 Restless legs syndrome: Secondary | ICD-10-CM | POA: Diagnosis not present

## 2020-01-25 DIAGNOSIS — Z0000 Encounter for general adult medical examination without abnormal findings: Secondary | ICD-10-CM | POA: Diagnosis not present

## 2020-01-25 DIAGNOSIS — R69 Illness, unspecified: Secondary | ICD-10-CM | POA: Diagnosis not present

## 2020-01-25 DIAGNOSIS — M1990 Unspecified osteoarthritis, unspecified site: Secondary | ICD-10-CM | POA: Diagnosis not present

## 2020-01-25 DIAGNOSIS — K219 Gastro-esophageal reflux disease without esophagitis: Secondary | ICD-10-CM | POA: Diagnosis not present

## 2020-01-25 DIAGNOSIS — R413 Other amnesia: Secondary | ICD-10-CM | POA: Diagnosis not present

## 2020-04-06 DIAGNOSIS — N390 Urinary tract infection, site not specified: Secondary | ICD-10-CM | POA: Diagnosis not present

## 2020-04-06 DIAGNOSIS — R300 Dysuria: Secondary | ICD-10-CM | POA: Diagnosis not present

## 2020-05-25 DIAGNOSIS — M1711 Unilateral primary osteoarthritis, right knee: Secondary | ICD-10-CM | POA: Diagnosis not present

## 2021-01-05 DIAGNOSIS — M1711 Unilateral primary osteoarthritis, right knee: Secondary | ICD-10-CM | POA: Diagnosis not present

## 2021-03-13 DIAGNOSIS — E039 Hypothyroidism, unspecified: Secondary | ICD-10-CM | POA: Diagnosis not present

## 2021-03-13 DIAGNOSIS — E559 Vitamin D deficiency, unspecified: Secondary | ICD-10-CM | POA: Diagnosis not present

## 2021-03-13 DIAGNOSIS — R7309 Other abnormal glucose: Secondary | ICD-10-CM | POA: Diagnosis not present

## 2021-03-13 DIAGNOSIS — E538 Deficiency of other specified B group vitamins: Secondary | ICD-10-CM | POA: Diagnosis not present

## 2021-03-13 DIAGNOSIS — E7801 Familial hypercholesterolemia: Secondary | ICD-10-CM | POA: Diagnosis not present

## 2021-03-13 DIAGNOSIS — I10 Essential (primary) hypertension: Secondary | ICD-10-CM | POA: Diagnosis not present

## 2021-05-16 DIAGNOSIS — N390 Urinary tract infection, site not specified: Secondary | ICD-10-CM | POA: Diagnosis not present

## 2021-05-16 DIAGNOSIS — Z0000 Encounter for general adult medical examination without abnormal findings: Secondary | ICD-10-CM | POA: Diagnosis not present

## 2021-05-16 DIAGNOSIS — Z23 Encounter for immunization: Secondary | ICD-10-CM | POA: Diagnosis not present

## 2021-05-16 DIAGNOSIS — M1990 Unspecified osteoarthritis, unspecified site: Secondary | ICD-10-CM | POA: Diagnosis not present

## 2021-05-16 DIAGNOSIS — R69 Illness, unspecified: Secondary | ICD-10-CM | POA: Diagnosis not present

## 2021-05-16 DIAGNOSIS — G2581 Restless legs syndrome: Secondary | ICD-10-CM | POA: Diagnosis not present

## 2021-08-11 DIAGNOSIS — R001 Bradycardia, unspecified: Secondary | ICD-10-CM | POA: Diagnosis not present

## 2021-08-11 DIAGNOSIS — M79603 Pain in arm, unspecified: Secondary | ICD-10-CM | POA: Diagnosis not present

## 2021-08-11 DIAGNOSIS — M5030 Other cervical disc degeneration, unspecified cervical region: Secondary | ICD-10-CM | POA: Diagnosis not present

## 2021-08-11 DIAGNOSIS — W1839XA Other fall on same level, initial encounter: Secondary | ICD-10-CM | POA: Diagnosis not present

## 2021-08-11 DIAGNOSIS — I6782 Cerebral ischemia: Secondary | ICD-10-CM | POA: Diagnosis not present

## 2021-08-11 DIAGNOSIS — R69 Illness, unspecified: Secondary | ICD-10-CM | POA: Diagnosis not present

## 2021-08-11 DIAGNOSIS — M1812 Unilateral primary osteoarthritis of first carpometacarpal joint, left hand: Secondary | ICD-10-CM | POA: Diagnosis not present

## 2021-08-11 DIAGNOSIS — M4312 Spondylolisthesis, cervical region: Secondary | ICD-10-CM | POA: Diagnosis not present

## 2021-08-11 DIAGNOSIS — Y998 Other external cause status: Secondary | ICD-10-CM | POA: Diagnosis not present

## 2021-08-11 DIAGNOSIS — S199XXA Unspecified injury of neck, initial encounter: Secondary | ICD-10-CM | POA: Diagnosis not present

## 2021-08-11 DIAGNOSIS — S0990XA Unspecified injury of head, initial encounter: Secondary | ICD-10-CM | POA: Diagnosis not present

## 2021-08-11 DIAGNOSIS — I6529 Occlusion and stenosis of unspecified carotid artery: Secondary | ICD-10-CM | POA: Diagnosis not present

## 2021-08-11 DIAGNOSIS — R410 Disorientation, unspecified: Secondary | ICD-10-CM | POA: Diagnosis not present

## 2021-08-11 DIAGNOSIS — I10 Essential (primary) hypertension: Secondary | ICD-10-CM | POA: Diagnosis not present

## 2021-08-11 DIAGNOSIS — G319 Degenerative disease of nervous system, unspecified: Secondary | ICD-10-CM | POA: Diagnosis not present

## 2021-08-11 DIAGNOSIS — I252 Old myocardial infarction: Secondary | ICD-10-CM | POA: Diagnosis not present

## 2021-08-11 DIAGNOSIS — Z743 Need for continuous supervision: Secondary | ICD-10-CM | POA: Diagnosis not present

## 2021-08-11 DIAGNOSIS — M25532 Pain in left wrist: Secondary | ICD-10-CM | POA: Diagnosis not present

## 2021-11-10 DIAGNOSIS — Z043 Encounter for examination and observation following other accident: Secondary | ICD-10-CM | POA: Diagnosis not present

## 2021-11-10 DIAGNOSIS — W1830XA Fall on same level, unspecified, initial encounter: Secondary | ICD-10-CM | POA: Diagnosis not present

## 2021-11-10 DIAGNOSIS — W19XXXA Unspecified fall, initial encounter: Secondary | ICD-10-CM | POA: Diagnosis not present

## 2021-11-10 DIAGNOSIS — G4489 Other headache syndrome: Secondary | ICD-10-CM | POA: Diagnosis not present

## 2021-11-10 DIAGNOSIS — M542 Cervicalgia: Secondary | ICD-10-CM | POA: Diagnosis not present

## 2021-11-10 DIAGNOSIS — T07XXXA Unspecified multiple injuries, initial encounter: Secondary | ICD-10-CM | POA: Diagnosis not present

## 2021-11-10 DIAGNOSIS — M4317 Spondylolisthesis, lumbosacral region: Secondary | ICD-10-CM | POA: Diagnosis not present

## 2021-11-10 DIAGNOSIS — M4316 Spondylolisthesis, lumbar region: Secondary | ICD-10-CM | POA: Diagnosis not present

## 2021-11-10 DIAGNOSIS — S0990XA Unspecified injury of head, initial encounter: Secondary | ICD-10-CM | POA: Diagnosis not present

## 2021-11-10 DIAGNOSIS — M545 Low back pain, unspecified: Secondary | ICD-10-CM | POA: Diagnosis not present

## 2021-11-10 DIAGNOSIS — K5730 Diverticulosis of large intestine without perforation or abscess without bleeding: Secondary | ICD-10-CM | POA: Diagnosis not present

## 2021-11-10 DIAGNOSIS — S199XXA Unspecified injury of neck, initial encounter: Secondary | ICD-10-CM | POA: Diagnosis not present

## 2021-11-10 DIAGNOSIS — R296 Repeated falls: Secondary | ICD-10-CM | POA: Diagnosis not present

## 2021-11-10 DIAGNOSIS — M549 Dorsalgia, unspecified: Secondary | ICD-10-CM | POA: Diagnosis not present

## 2021-11-10 DIAGNOSIS — M791 Myalgia, unspecified site: Secondary | ICD-10-CM | POA: Diagnosis not present

## 2021-11-10 DIAGNOSIS — Z743 Need for continuous supervision: Secondary | ICD-10-CM | POA: Diagnosis not present

## 2021-11-10 DIAGNOSIS — Z79899 Other long term (current) drug therapy: Secondary | ICD-10-CM | POA: Diagnosis not present

## 2021-12-28 DIAGNOSIS — E871 Hypo-osmolality and hyponatremia: Secondary | ICD-10-CM | POA: Diagnosis not present

## 2021-12-28 DIAGNOSIS — F0390 Unspecified dementia without behavioral disturbance: Secondary | ICD-10-CM | POA: Diagnosis not present

## 2021-12-28 DIAGNOSIS — I2510 Atherosclerotic heart disease of native coronary artery without angina pectoris: Secondary | ICD-10-CM | POA: Diagnosis not present

## 2021-12-28 DIAGNOSIS — K5730 Diverticulosis of large intestine without perforation or abscess without bleeding: Secondary | ICD-10-CM | POA: Diagnosis not present

## 2021-12-28 DIAGNOSIS — R531 Weakness: Secondary | ICD-10-CM | POA: Diagnosis not present

## 2021-12-28 DIAGNOSIS — G8911 Acute pain due to trauma: Secondary | ICD-10-CM | POA: Diagnosis not present

## 2021-12-28 DIAGNOSIS — A419 Sepsis, unspecified organism: Secondary | ICD-10-CM | POA: Diagnosis not present

## 2021-12-28 DIAGNOSIS — Z791 Long term (current) use of non-steroidal anti-inflammatories (NSAID): Secondary | ICD-10-CM | POA: Diagnosis not present

## 2021-12-28 DIAGNOSIS — R29898 Other symptoms and signs involving the musculoskeletal system: Secondary | ICD-10-CM | POA: Diagnosis not present

## 2021-12-28 DIAGNOSIS — R059 Cough, unspecified: Secondary | ICD-10-CM | POA: Diagnosis not present

## 2021-12-28 DIAGNOSIS — Z7982 Long term (current) use of aspirin: Secondary | ICD-10-CM | POA: Diagnosis not present

## 2021-12-28 DIAGNOSIS — K219 Gastro-esophageal reflux disease without esophagitis: Secondary | ICD-10-CM | POA: Diagnosis not present

## 2021-12-28 DIAGNOSIS — N3001 Acute cystitis with hematuria: Secondary | ICD-10-CM | POA: Diagnosis not present

## 2021-12-28 DIAGNOSIS — I517 Cardiomegaly: Secondary | ICD-10-CM | POA: Diagnosis not present

## 2021-12-28 DIAGNOSIS — R5383 Other fatigue: Secondary | ICD-10-CM | POA: Diagnosis not present

## 2021-12-28 DIAGNOSIS — Z9181 History of falling: Secondary | ICD-10-CM | POA: Diagnosis not present

## 2021-12-28 DIAGNOSIS — R2689 Other abnormalities of gait and mobility: Secondary | ICD-10-CM | POA: Diagnosis not present

## 2021-12-28 DIAGNOSIS — R5381 Other malaise: Secondary | ICD-10-CM | POA: Diagnosis not present

## 2021-12-28 DIAGNOSIS — I7090 Unspecified atherosclerosis: Secondary | ICD-10-CM | POA: Diagnosis not present

## 2021-12-28 DIAGNOSIS — K449 Diaphragmatic hernia without obstruction or gangrene: Secondary | ICD-10-CM | POA: Diagnosis not present

## 2021-12-28 DIAGNOSIS — R509 Fever, unspecified: Secondary | ICD-10-CM | POA: Diagnosis not present

## 2021-12-28 DIAGNOSIS — N3090 Cystitis, unspecified without hematuria: Secondary | ICD-10-CM | POA: Diagnosis not present

## 2021-12-28 DIAGNOSIS — R296 Repeated falls: Secondary | ICD-10-CM | POA: Diagnosis not present

## 2021-12-28 DIAGNOSIS — I700 Atherosclerosis of aorta: Secondary | ICD-10-CM | POA: Diagnosis not present

## 2021-12-28 DIAGNOSIS — R69 Illness, unspecified: Secondary | ICD-10-CM | POA: Diagnosis not present

## 2021-12-28 DIAGNOSIS — Z79899 Other long term (current) drug therapy: Secondary | ICD-10-CM | POA: Diagnosis not present

## 2021-12-28 DIAGNOSIS — N3000 Acute cystitis without hematuria: Secondary | ICD-10-CM | POA: Diagnosis not present

## 2021-12-28 DIAGNOSIS — Z743 Need for continuous supervision: Secondary | ICD-10-CM | POA: Diagnosis not present

## 2021-12-29 DIAGNOSIS — R69 Illness, unspecified: Secondary | ICD-10-CM | POA: Diagnosis not present

## 2021-12-29 DIAGNOSIS — E871 Hypo-osmolality and hyponatremia: Secondary | ICD-10-CM | POA: Diagnosis not present

## 2021-12-29 DIAGNOSIS — R531 Weakness: Secondary | ICD-10-CM | POA: Diagnosis not present

## 2021-12-29 DIAGNOSIS — Z7189 Other specified counseling: Secondary | ICD-10-CM | POA: Diagnosis not present

## 2021-12-29 DIAGNOSIS — R5381 Other malaise: Secondary | ICD-10-CM | POA: Diagnosis not present

## 2021-12-29 DIAGNOSIS — N3000 Acute cystitis without hematuria: Secondary | ICD-10-CM | POA: Diagnosis not present

## 2021-12-29 DIAGNOSIS — Z515 Encounter for palliative care: Secondary | ICD-10-CM | POA: Diagnosis not present

## 2021-12-29 DIAGNOSIS — K219 Gastro-esophageal reflux disease without esophagitis: Secondary | ICD-10-CM | POA: Diagnosis not present

## 2021-12-29 DIAGNOSIS — A419 Sepsis, unspecified organism: Secondary | ICD-10-CM | POA: Diagnosis not present

## 2021-12-30 DIAGNOSIS — N3000 Acute cystitis without hematuria: Secondary | ICD-10-CM | POA: Diagnosis not present

## 2021-12-30 DIAGNOSIS — K219 Gastro-esophageal reflux disease without esophagitis: Secondary | ICD-10-CM | POA: Diagnosis not present

## 2021-12-30 DIAGNOSIS — R5381 Other malaise: Secondary | ICD-10-CM | POA: Diagnosis not present

## 2021-12-30 DIAGNOSIS — R69 Illness, unspecified: Secondary | ICD-10-CM | POA: Diagnosis not present

## 2021-12-30 DIAGNOSIS — A419 Sepsis, unspecified organism: Secondary | ICD-10-CM | POA: Diagnosis not present

## 2021-12-30 DIAGNOSIS — E871 Hypo-osmolality and hyponatremia: Secondary | ICD-10-CM | POA: Diagnosis not present

## 2022-01-16 DEATH — deceased
